# Patient Record
Sex: Male | Born: 1955
Health system: Southern US, Community
[De-identification: ages and names within clinical notes are randomized; demographics above are authoritative.]

## PROBLEM LIST (undated history)

## (undated) DIAGNOSIS — E785 Hyperlipidemia, unspecified: Secondary | ICD-10-CM

## (undated) DIAGNOSIS — I10 Essential (primary) hypertension: Secondary | ICD-10-CM

## (undated) DIAGNOSIS — H8110 Benign paroxysmal vertigo, unspecified ear: Secondary | ICD-10-CM

## (undated) HISTORY — DX: Essential (primary) hypertension: I10

## (undated) HISTORY — PX: CHOLECYSTECTOMY: SHX55

## (undated) HISTORY — DX: Benign paroxysmal vertigo, unspecified ear: H81.10

## (undated) HISTORY — PX: CYSTECTOMY: SUR359

## (undated) HISTORY — DX: Hyperlipidemia, unspecified: E78.5

---

## 2005-09-10 ENCOUNTER — Emergency Department (HOSPITAL_COMMUNITY): Admission: EM | Admit: 2005-09-10 | Discharge: 2005-09-10 | Payer: Self-pay | Admitting: Emergency Medicine

## 2008-06-20 ENCOUNTER — Encounter: Admission: RE | Admit: 2008-06-20 | Discharge: 2008-06-20 | Payer: Self-pay | Admitting: Family Medicine

## 2008-06-24 ENCOUNTER — Encounter: Admission: RE | Admit: 2008-06-24 | Discharge: 2008-06-24 | Payer: Self-pay | Admitting: Family Medicine

## 2009-02-04 ENCOUNTER — Emergency Department (HOSPITAL_COMMUNITY): Admission: EM | Admit: 2009-02-04 | Discharge: 2009-02-04 | Payer: Self-pay | Admitting: Family Medicine

## 2010-12-01 ENCOUNTER — Encounter
Admission: RE | Admit: 2010-12-01 | Discharge: 2010-12-01 | Payer: Self-pay | Source: Home / Self Care | Attending: Family Medicine | Admitting: Family Medicine

## 2010-12-31 ENCOUNTER — Ambulatory Visit (HOSPITAL_COMMUNITY)
Admission: RE | Admit: 2010-12-31 | Discharge: 2010-12-31 | Disposition: A | Discharge status: Home / Self Care | Payer: 59 | Source: Ambulatory Visit | Attending: General Surgery | Admitting: General Surgery

## 2010-12-31 ENCOUNTER — Encounter (HOSPITAL_COMMUNITY)
Admission: RE | Admit: 2010-12-31 | Discharge: 2010-12-31 | Disposition: A | Discharge status: Home / Self Care | Payer: 59 | Source: Ambulatory Visit | Attending: General Surgery | Admitting: General Surgery

## 2010-12-31 ENCOUNTER — Other Ambulatory Visit (HOSPITAL_COMMUNITY): Payer: Self-pay | Admitting: General Surgery

## 2010-12-31 DIAGNOSIS — Z01810 Encounter for preprocedural cardiovascular examination: Secondary | ICD-10-CM | POA: Insufficient documentation

## 2010-12-31 DIAGNOSIS — Z01812 Encounter for preprocedural laboratory examination: Secondary | ICD-10-CM | POA: Insufficient documentation

## 2010-12-31 DIAGNOSIS — K8020 Calculus of gallbladder without cholecystitis without obstruction: Secondary | ICD-10-CM | POA: Insufficient documentation

## 2010-12-31 DIAGNOSIS — Z01818 Encounter for other preprocedural examination: Secondary | ICD-10-CM | POA: Insufficient documentation

## 2010-12-31 DIAGNOSIS — I10 Essential (primary) hypertension: Secondary | ICD-10-CM | POA: Insufficient documentation

## 2010-12-31 LAB — DIFFERENTIAL
Basophils Relative: 1 % (ref 0–1)
Eosinophils Absolute: 0.2 10*3/uL (ref 0.0–0.7)
Eosinophils Relative: 3 % (ref 0–5)
Lymphocytes Relative: 18 % (ref 12–46)
Lymphs Abs: 1.2 10*3/uL (ref 0.7–4.0)
Monocytes Absolute: 0.6 10*3/uL (ref 0.1–1.0)
Monocytes Relative: 10 % (ref 3–12)
Neutrophils Relative %: 70 % (ref 43–77)

## 2010-12-31 LAB — COMPREHENSIVE METABOLIC PANEL
ALT: 29 U/L (ref 0–53)
Albumin: 3.9 g/dL (ref 3.5–5.2)
BUN: 14 mg/dL (ref 6–23)
CO2: 29 mEq/L (ref 19–32)
Calcium: 9.4 mg/dL (ref 8.4–10.5)
Chloride: 105 mEq/L (ref 96–112)
Creatinine, Ser: 0.97 mg/dL (ref 0.4–1.5)
GFR calc Af Amer: >60 mL/min (ref >60)
GFR calc non Af Amer: >60 mL/min (ref >60)
Glucose, Bld: 88 mg/dL (ref 70–99)
Potassium: 4.5 mEq/L (ref 3.5–5.1)
Sodium: 142 mEq/L (ref 135–145)
Total Bilirubin: 1 mg/dL (ref 0.3–1.2)
Total Protein: 6.1 g/dL (ref 6.0–8.3)

## 2010-12-31 LAB — CBC
HCT: 42.8 % (ref 39.0–52.0)
Hemoglobin: 15.4 g/dL (ref 13.0–17.0)
MCH: 32.7 pg (ref 26.0–34.0)
MCV: 90.9 fL (ref 78.0–100.0)
Platelets: 163 10*3/uL (ref 150–400)
RDW: 13.1 % (ref 11.5–15.5)
WBC: 6.5 10*3/uL (ref 4.0–10.5)

## 2010-12-31 LAB — SURGICAL PCR SCREEN: MRSA, PCR: NEGATIVE

## 2011-01-06 ENCOUNTER — Other Ambulatory Visit: Payer: Self-pay | Admitting: General Surgery

## 2011-01-06 ENCOUNTER — Observation Stay (HOSPITAL_COMMUNITY)
Admission: RE | Admit: 2011-01-06 | Discharge: 2011-01-08 | Disposition: A | Discharge status: Home / Self Care | Payer: 59 | Source: Ambulatory Visit | Attending: General Surgery | Admitting: General Surgery

## 2011-01-06 ENCOUNTER — Ambulatory Visit (HOSPITAL_COMMUNITY): Payer: 59

## 2011-01-06 DIAGNOSIS — I10 Essential (primary) hypertension: Secondary | ICD-10-CM | POA: Insufficient documentation

## 2011-01-06 DIAGNOSIS — K8010 Calculus of gallbladder with chronic cholecystitis without obstruction: Principal | ICD-10-CM | POA: Insufficient documentation

## 2011-01-07 ENCOUNTER — Observation Stay (HOSPITAL_COMMUNITY): Payer: 59

## 2011-01-07 LAB — COMPREHENSIVE METABOLIC PANEL
ALT: 269 U/L — ABNORMAL HIGH (ref 0–53)
AST: 119 U/L — ABNORMAL HIGH (ref 0–37)
Albumin: 3.4 g/dL — ABNORMAL LOW (ref 3.5–5.2)
Alkaline Phosphatase: 47 U/L (ref 39–117)
BUN: 8 mg/dL (ref 6–23)
Calcium: 8.9 mg/dL (ref 8.4–10.5)
Chloride: 105 mEq/L (ref 96–112)
Creatinine, Ser: 0.93 mg/dL (ref 0.4–1.5)
GFR calc Af Amer: >60 mL/min (ref >60)
Glucose, Bld: 117 mg/dL — ABNORMAL HIGH (ref 70–99)
Potassium: 4.3 mEq/L (ref 3.5–5.1)
Sodium: 138 mEq/L (ref 135–145)
Total Bilirubin: 0.8 mg/dL (ref 0.3–1.2)

## 2011-01-08 LAB — COMPREHENSIVE METABOLIC PANEL
ALT: 191 U/L — ABNORMAL HIGH (ref 0–53)
AST: 53 U/L — ABNORMAL HIGH (ref 0–37)
Albumin: 3.6 g/dL (ref 3.5–5.2)
Alkaline Phosphatase: 49 U/L (ref 39–117)
BUN: 8 mg/dL (ref 6–23)
CO2: 31 mEq/L (ref 19–32)
Creatinine, Ser: 0.82 mg/dL (ref 0.4–1.5)
GFR calc Af Amer: >60 mL/min (ref >60)
Glucose, Bld: 92 mg/dL (ref 70–99)
Potassium: 3.8 mEq/L (ref 3.5–5.1)
Sodium: 140 mEq/L (ref 135–145)
Total Bilirubin: 1 mg/dL (ref 0.3–1.2)
Total Protein: 6.2 g/dL (ref 6.0–8.3)

## 2011-01-14 NOTE — Consult Note (Signed)
  James Young, [place]ST[place]T NO.:  [id number]  MEDICAL RECORD NO.:  [id number]           PATIENT TYPE:  O  LOCATION:  XRAY                         FACILITY:  [facility]  PHYSICIAN:  Alekai Pocock C. Madilyn Fireman, M.D.    DATE OF BIRTH:  [birth year]  DATE OF CONSULTATION: DATE OF DISCHARGE:  12/31/2010                                CONSULTATION   REASON FOR CONSULTATION:  Suspected common bile duct stone.  HISTORY OF PRESENT ILLNESS:  The patient is a 55 year old white male who underwent elective cholecystectomy for symptomatic gallstones.  He had an intraoperative cholangiogram, which did not show any drainage of distal bile duct.  There was no definite stone seen, but there was no drainage.  The bile duct was 4-5 mm in diameter.  He had preoperative liver function tests, which were normal.  PAST MEDICAL HISTORY: 1. Hypertension. 2. Hyperlipidemia.  MEDICATIONS:  Lovastatin, lisinopril, aspirin.  PAST SURGICAL HISTORY:  Cyst removed from back.  SOCIAL HISTORY:  The patient denies cigarette smoking.  Drinks alcohol occasionally.  He is married.  He has three children.  ALLERGIES:  None known.  PHYSICAL EXAM:  GENERAL:  Well-developed, well-nourished white male, in no acute stress. HEART:  Regular rate and rhythm without murmurs or rubs. ABDOMEN:  Soft, nondistended with normoactive bowel sounds.  No hepatosplenomegaly, mass or guarding.  There is mild tenderness over his laparoscopy sites.  IMPRESSION:  Relatively low suspicion of retained common bile duct stones based solely on cholangiogram.  PLAN:  We will reassess tomorrow.  Recheck liver function tests and decide whether to perform ERCP, MRCP or expectant management.  We will follow with you.         ______________________________ Everardo All. Madilyn Fireman, M.D.    JCH/MEDQ  D:  01/06/2011  T:  01/06/2011  Job:  [id number]  Electronically Signed by Dorena Cookey M.D. on 01/12/2011 07:10:31 PM

## 2011-01-14 NOTE — Op Note (Signed)
  James Young, DAVIDOW                ACCOUNT NO.:  [id number]  MEDICAL RECORD NO.:  [id number]           PATIENT TYPE:  LOCATION:                                 FACILITY:  PHYSICIAN:  Reisa Coppola C. Madilyn Fireman, M.D.    DATE OF BIRTH:  1956-02-26  DATE OF PROCEDURE:  01/07/2011 DATE OF DISCHARGE:                              OPERATIVE REPORT   PROCEDURE:  An endoscopic retrograde cholangiopancreatography with sphincterotomy and stone extraction.  INDICATIONS FOR PROCEDURE:  Suspected common bile duct stone on intraoperative cholangiogram with rise in liver function tests overnight.  PROCEDURE IN DETAIL:  The patient was placed in the prone position and placed on the pulse monitor with continuous low-flow oxygen delivered by nasal cannula.  He was sedated with 120 mcg of IV fentanyl and 12 mg IV Versed.  Olympus video side-viewing endoscope was advanced blindly in the oropharynx, esophagus, stomach.  The pylorus was traversed and papilla of Vater located on the medial duodenal wall.  It had a normal appearance.  Free cannulation was initially difficult.  Initial guide wire and dye opacification filled a normal-appearing pancreatic duct with only one injection done.  Subsequent selective cannulation was achieved with a guidewire and common bile duct and a cholangiogram was obtained, which showed a normal to slightly dilated common bile duct with some slight distal dilatation.  I could not clearly see a stone.  I went ahead and made a sphincterotomy and some sludge was seen to exit from the papilla.  Several balloon sweeps were made and another small black specks of sludge were retrieved, but no definite stone of any greater than 1 mm.  After multiple balloon sweeps, the cholangiograms appeared to be clear and there was good drainage.  The scope was then withdrawn and the patient returned to the recovery room in stable condition.  He tolerated the procedure well.  There were no  immediate complications.  IMPRESSION:  Common bile duct sludge removed after sphincterotomy.  PLAN:  Observe overnight for complications, probable discharge in the morning.          ______________________________ Everardo All. Madilyn Fireman, M.D.     JCH/MEDQ  D:  01/07/2011  T:  01/08/2011  Job:  [id number]  cc:   Ollen Gross. Vernell Morgans, M.D.  Electronically Signed by Dorena Cookey M.D. on 01/12/2011 07:10:44 PM

## 2011-01-18 NOTE — Op Note (Signed)
NAMEGASPER, HOPES NO.:  [id number]  MEDICAL RECORD NO.:  [id number]           PATIENT TYPE:  I  LOCATION:  5152                         FACILITY:  [facility]  PHYSICIAN:  Ollen Gross. Vernell Morgans, M.D. DATE OF BIRTH:  1956/07/30  DATE OF PROCEDURE:  01/06/2011 DATE OF DISCHARGE:                              OPERATIVE REPORT   PREOPERATIVE DIAGNOSIS:  Gallstones.  POSTOPERATIVE DIAGNOSIS:  Gallstones with possible common duct stone.  PROCEDURES:  Laparoscopic cholecystectomy with intraoperative cholangiogram.  SURGEON:  Ollen Gross. Vernell Morgans, MD  ASSISTANT:  Wilmon Arms. Tsuei, MD  ANESTHESIA:  General endotracheal.  DESCRIPTION OF PROCEDURE:  After informed consent was obtained, the patient was brought to the operating room, placed in supine position on the operating table.  After induction of general anesthesia, the patient's abdomen was prepped with ChloraPrep, allowed to dry, and draped in usual sterile fashion.  The area below the umbilicus was infiltrated with 0.25% Marcaine.  A small incision was made with 15 blade knife.  This incision was carried down through the subcutaneous tissue bluntly with hemostat and [employer] retractors until linea alba was identified.  The linea alba was incised with 15 blade knife on each side, was grasped with Kocher clamps and elevated anteriorly, and the preperitoneal space was then probed bluntly with hemostat until the peritoneum was opened.  Access was gained to the abdominal cavity.  A 0 Vicryl pursestring stitch was placed in the fascia around the opening. Hasson cannula was placed through the opening and anchored in place with a previously placed Vicryl pursestring stitch.  The abdomen was then insufflated with carbon dioxide without difficulty.  The patient was placed in reverse Trendelenburg position and rotated with the right side up.  The laparoscope was inserted through Hasson Cannula.  The dome of the gallbladder and  liver readily identified.  The epigastric region was then infiltrated with 0.25% Marcaine.  A small incision was made with a 15 blade knife and a 10-mm port was placed bluntly through this incision into the abdominal cavity under direct vision.  Sites were then chosen laterally on the right side of the abdomen with placement of 5-mm ports. Each of these areas was infiltrated with 0.25% Marcaine.  Small stab incisions were made with 15 blade knife.  A 5-mm port placed bluntly through these incisions into the abdominal cavity under direct vision. Blunt grasper was placed through the lateral-most 5-mm port and used to grasp the dome of gallbladder and elevated anteriorly and superiorly. Another blunt grasper was placed through other 5-mm port and used to retract the body and neck of the gallbladder.  Some omental adhesions to the body of the gallbladder taken down sharply with the electrocautery. The peritoneal reflection at the gallbladder neck was then opened sharply with the electrocautery.  Blunt dissection was carried out in this area until the gallbladder neck and cystic duct junction was readily identified and a good window was created.  Single clip was placed on the gallbladder neck.  A small ductotomy was made just below the clip with the laparoscopic scissors.  A 14-gauge Angiocath was  placed percutaneously through the anterior abdominal wall under direct vision.  A Reddick cholangiogram catheter was placed through the Angiocath and flushed.  The Reddick catheter was then placed in the cystic duct and anchored in place of the clip.  A cholangiogram was obtained that showed an obstruction to the very distal common bile duct near the ampulla with no emptying into the duodenum.  There was good length on the cystic duct, but it was spiralled and we could not see the catheter down to try to open up the common bile duct.  We therefore removed the clips and catheters.  Three clips were  placed proximal on the cystic duct, and duct was divided between 2 sets of clips. Posterior to this, the cystic artery was identified with some branches. Each was clipped with 2 clips proximally and distally, and divided the 2.  Next, a laparoscopic hook cautery device was used to separate the gallbladder from liver bed prior to completely detaching the gallbladder from liver bed.  The liver bed was inspected and several small bleeding points were coagulated with the electrocautery until the area was completely hemostatic.  The gallbladder was then detached and wrested away from liver bed without difficult.  The laparoscopic bag was inserted through the epigastric port.  Gallbladder was placed in the bag.  The bag was sealed.  The laparoscope was then moved to the epigastric port.  A gallbladder grasper was placed through the Hasson cannula and used to grasp the opening of the bag.  The bag with the gallbladder was removed with the [facility] cannula through the infraumbilical port without difficulty.  The fascial defect was closed with previously placed Vicryl pursestring stitch as well as with another figure-of-eight 0 Vicryl stitch.  The rest of ports were removed under direct vision and all found to be hemostatic.  The gas was allowed to escape.  The skin incisions were all closed with interrupted 4-0 Monocryl subcuticular stitches.  Dermabond dressings were then applied. The patient tolerated the procedure well.  At the end of the case, all needle, sponge, and instrument counts were correct.  The patient was then awakened and taken to recovery room in stable condition.     Ollen Gross. Vernell Morgans, M.D.     PST/MEDQ  D:  01/06/2011  T:  01/06/2011  Job:  [id number]  Electronically Signed by Chevis Pretty III M.D. on 01/18/2011 07:02:56 AM

## 2011-02-01 NOTE — Discharge Summary (Signed)
  NAMEJAQUA, CHING NO.:  [id number]  MEDICAL RECORD NO.:  [id number]           PATIENT TYPE:  I  LOCATION:  5152                         FACILITY:  [facility]  PHYSICIAN:  Ollen Gross. Vernell Morgans, M.D. DATE OF BIRTH:  November 22, 1955  DATE OF ADMISSION:  01/06/2011 DATE OF DISCHARGE:  01/08/2011                              DISCHARGE SUMMARY   Mr. Scheffler is a 55 year old gentleman, who had gallstones and normal liver functions.  He was brought to the operating room on February 29 and underwent a laparoscopic cholecystectomy with intraoperative cholangiograms.  His cholangiogram showed a possible distal common bile duct stone with no emptying into the duodenum.  A GI consult was obtained postoperatively.  They proceeded with ERCP on March 1.  He tolerated this well.  On March 2, he was doing better and ready for discharge home.  DIET:  As tolerated.  ACTIVITIES:  No heavy lifting.  MEDICATIONS:  He was to resume his home meds.  He was given a prescription for pain medicine.  FINAL DIAGNOSIS:  Gallstones.  FOLLOWUP:  With Dr. Carolynne Edouard in the next week or two and he is discharged home.     Ollen Gross. Vernell Morgans, M.D.     PST/MEDQ  D:  01/19/2011  T:  01/20/2011  Job:  [id number]  Electronically Signed by Chevis Pretty III M.D. on 02/01/2011 07:30:43 AM

## 2011-02-18 LAB — CULTURE, ROUTINE-ABSCESS

## 2015-04-02 ENCOUNTER — Encounter (HOSPITAL_COMMUNITY): Payer: Self-pay | Admitting: Emergency Medicine

## 2015-04-02 ENCOUNTER — Emergency Department (HOSPITAL_COMMUNITY)
Admission: EM | Admit: 2015-04-02 | Discharge: 2015-04-02 | Disposition: A | Discharge status: Home / Self Care | Payer: BLUE CROSS/BLUE SHIELD | Attending: Emergency Medicine | Admitting: Emergency Medicine

## 2015-04-02 ENCOUNTER — Emergency Department (HOSPITAL_COMMUNITY): Payer: BLUE CROSS/BLUE SHIELD

## 2015-04-02 DIAGNOSIS — I10 Essential (primary) hypertension: Secondary | ICD-10-CM | POA: Diagnosis not present

## 2015-04-02 DIAGNOSIS — Z7982 Long term (current) use of aspirin: Secondary | ICD-10-CM | POA: Diagnosis not present

## 2015-04-02 DIAGNOSIS — Z79899 Other long term (current) drug therapy: Secondary | ICD-10-CM | POA: Insufficient documentation

## 2015-04-02 DIAGNOSIS — R091 Pleurisy: Secondary | ICD-10-CM | POA: Insufficient documentation

## 2015-04-02 DIAGNOSIS — R079 Chest pain, unspecified: Secondary | ICD-10-CM | POA: Diagnosis not present

## 2015-04-02 DIAGNOSIS — R05 Cough: Secondary | ICD-10-CM | POA: Diagnosis not present

## 2015-04-02 DIAGNOSIS — I2510 Atherosclerotic heart disease of native coronary artery without angina pectoris: Secondary | ICD-10-CM | POA: Diagnosis not present

## 2015-04-02 LAB — BASIC METABOLIC PANEL
Anion gap: 9 (ref 5–15)
BUN: 20 mg/dL (ref 6–20)
CALCIUM: 9.1 mg/dL (ref 8.9–10.3)
CHLORIDE: 101 mmol/L (ref 101–111)
CO2: 28 mmol/L (ref 22–32)
CREATININE: 0.94 mg/dL (ref 0.61–1.24)
GFR calc Af Amer: >60 mL/min (ref >60)
GFR calc non Af Amer: >60 mL/min (ref >60)
Glucose, Bld: 118 mg/dL — ABNORMAL HIGH (ref 65–99)
Potassium: 4.3 mmol/L (ref 3.5–5.1)
SODIUM: 138 mmol/L (ref 135–145)

## 2015-04-02 LAB — TROPONIN I

## 2015-04-02 LAB — CBC
HEMATOCRIT: 42.1 % (ref 39.0–52.0)
Hemoglobin: 14.4 g/dL (ref 13.0–17.0)
MCH: 31.4 pg (ref 26.0–34.0)
MCHC: 34.2 g/dL (ref 30.0–36.0)
MCV: 91.9 fL (ref 78.0–100.0)
PLATELETS: 221 10*3/uL (ref 150–400)
RBC: 4.58 MIL/uL (ref 4.22–5.81)
RDW: 13.5 % (ref 11.5–15.5)
WBC: 10.1 10*3/uL (ref 4.0–10.5)

## 2015-04-02 LAB — D-DIMER, QUANTITATIVE: D-Dimer, Quant: 1 ug/mL-FEU — ABNORMAL HIGH (ref 0.00–0.48)

## 2015-04-02 LAB — BRAIN NATRIURETIC PEPTIDE: B Natriuretic Peptide: 28.9 pg/mL (ref 0.0–100.0)

## 2015-04-02 LAB — I-STAT TROPONIN, ED: TROPONIN I, POC: 0 ng/mL (ref 0.00–0.08)

## 2015-04-02 MED ORDER — GI COCKTAIL ~~LOC~~
30.0000 mL | Freq: Once | ORAL | Status: AC
  Administered 2015-04-02: 30 mL via ORAL
  Filled 2015-04-02: qty 30

## 2015-04-02 MED ORDER — IOHEXOL 350 MG/ML SOLN
100.0000 mL | Freq: Once | INTRAVENOUS | Status: AC | PRN
  Administered 2015-04-02: 80 mL via INTRAVENOUS

## 2015-04-02 NOTE — Discharge Instructions (Signed)
You were seen today for chest pain. Your pain was atypical for heart attack; however, on her CT scan you were noted to have coronary artery disease. He has no evidence of blood clot. The description of your pain and recent cough with upper respiratory symptoms, makes it likely the you are experiencing lung irritation. Regardless he need to follow-up with cardiology for definitive cardiac testing. You also need to follow-up with your primary physician. You were noted to have a nodule on her CT scan as well. This may need follow-up.  Chest Pain (Nonspecific) It is often hard to give a specific diagnosis for the cause of chest pain. There is always a chance that your pain could be related to something serious, such as a heart attack or a blood clot in the lungs. You need to follow up with your health care provider for further evaluation. CAUSES   Heartburn.  Pneumonia or bronchitis.  Anxiety or stress.  Inflammation around your heart (pericarditis) or lung (pleuritis or pleurisy).  A blood clot in the lung.  A collapsed lung (pneumothorax). It can develop suddenly on its own (spontaneous pneumothorax) or from trauma to the chest.  Shingles infection (herpes zoster virus). The chest wall is composed of bones, muscles, and cartilage. Any of these can be the source of the pain.  The bones can be bruised by injury.  The muscles or cartilage can be strained by coughing or overwork.  The cartilage can be affected by inflammation and become sore (costochondritis). DIAGNOSIS  Lab tests or other studies may be needed to find the cause of your pain. Your health care provider may have you take a test called an ambulatory electrocardiogram (ECG). An ECG records your heartbeat patterns over a 24-hour period. You may also have other tests, such as:  Transthoracic echocardiogram (TTE). During echocardiography, sound waves are used to evaluate how blood flows through your heart.  Transesophageal  echocardiogram (TEE).  Cardiac monitoring. This allows your health care provider to monitor your heart rate and rhythm in real time.  Holter monitor. This is a portable device that records your heartbeat and can help diagnose heart arrhythmias. It allows your health care provider to track your heart activity for several days, if needed.  Stress tests by exercise or by giving medicine that makes the heart beat faster. TREATMENT   Treatment depends on what may be causing your chest pain. Treatment may include:  Acid blockers for heartburn.  Anti-inflammatory medicine.  Pain medicine for inflammatory conditions.  Antibiotics if an infection is present.  You may be advised to change lifestyle habits. This includes stopping smoking and avoiding alcohol, caffeine, and chocolate.  You may be advised to keep your head raised (elevated) when sleeping. This reduces the chance of acid going backward from your stomach into your esophagus. Most of the time, nonspecific chest pain will improve within 2-3 days with rest and mild pain medicine.  HOME CARE INSTRUCTIONS   If antibiotics were prescribed, take them as directed. Finish them even if you start to feel better.  For the next few days, avoid physical activities that bring on chest pain. Continue physical activities as directed.  Do not use any tobacco products, including cigarettes, chewing tobacco, or electronic cigarettes.  Avoid drinking alcohol.  Only take medicine as directed by your health care provider.  Follow your health care provider's suggestions for further testing if your chest pain does not go away.  Keep any follow-up appointments you made. If you do not  go to an appointment, you could develop lasting (chronic) problems with pain. If there is any problem keeping an appointment, call to reschedule. SEEK MEDICAL CARE IF:   Your chest pain does not go away, even after treatment.  You have a rash with blisters on your  chest.  You have a fever. SEEK IMMEDIATE MEDICAL CARE IF:   You have increased chest pain or pain that spreads to your arm, neck, jaw, back, or abdomen.  You have shortness of breath.  You have an increasing cough, or you cough up blood.  You have severe back or abdominal pain.  You feel nauseous or vomit.  You have severe weakness.  You faint.  You have chills. This is an emergency. Do not wait to see if the pain will go away. Get medical help at once. Call your local emergency services (911 in [place] Do not drive yourself to the hospital. MAKE SURE YOU:   Understand these instructions.  Will watch your condition.  Will get help right away if you are not doing well or get worse. Document Released: 08/04/2005 Document Revised: 10/30/2013 Document Reviewed: 05/30/2008 [facility] Patient Information 2015 [place], [place]. This information is not intended to replace advice given to you by your health care provider. Make sure you discuss any questions you have with your health care provider.

## 2015-04-02 NOTE — ED Notes (Addendum)
Pt states that he was lying down to go to bed and started having mid-left chest pain that radiated to his left jaw, shoulder, and ear. Pt reports pain when he tries to take a deep breath, states that he recently got over a cold and was coughing a lot then, denies having cough today. Pt states he took 3 baby asa and pain has now decreased to 2/10 down from 5/10. Pt alert, oriented, nad,

## 2015-04-02 NOTE — ED Provider Notes (Signed)
CSN: [id number]     Arrival date & time 04/02/15  0128 History  This chart was scribed for Shon Baton, MD by Doreatha Martin, ED Scribe. This patient was seen in room B14C/B14C and the patient's care was started at 2:05 AM.     Chief Complaint  Patient presents with  . Chest Pain   The history is provided by the patient. No language interpreter was used.   HPI Comments: James Young is a 59 y.o. male with Hx of HTN who presents to the Emergency Department complaining of moderate, constant, burning, stabbing 1/10 left-sided CP radiating to the left shoulder, left jaw and left ear onset earlier this evening. Pt states he was asleep prior to onset. He also reports stabbing pain exacerbated with inhalation. He states that he has not had pain like this before. Pt reports that his mild HTN is well controlled by Lisinopril. Pt states he took 3 81 mg Aspirin PTA with moderate relief. He reports that he recently recovered from a cold with which he had associated cough. Pt denies recent long travels, Hx of HLD, DM, and FHx of CAD<55 y.o. He also denies edema in the legs, SOB or cough.   History reviewed. No pertinent past medical history. History reviewed. No pertinent past surgical history. No family history on file. History  Substance Use Topics  . Smoking status: Never Smoker   . Smokeless tobacco: Not on file  . Alcohol Use: Yes    Review of Systems  Constitutional: Negative.  Negative for fever.  Respiratory: Positive for cough. Negative for chest tightness and shortness of breath.   Cardiovascular: Positive for chest pain.  Gastrointestinal: Negative.  Negative for nausea, vomiting and abdominal pain.  Genitourinary: Negative.  Negative for dysuria.  Musculoskeletal: Negative for back pain.  Neurological: Negative for headaches.  All other systems reviewed and are negative.     Allergies  Review of patient's allergies indicates no known allergies.  Home Medications   Prior to  Admission medications   Medication Sig Start Date End Date Taking? Authorizing Provider  aspirin 81 MG chewable tablet Chew 81 mg by mouth daily.   Yes Historical Provider, MD  lisinopril (PRINIVIL,ZESTRIL) 10 MG tablet Take 10 mg by mouth daily.  03/03/15  Yes Historical Provider, MD  lovastatin (MEVACOR) 10 MG tablet Take 10 mg by mouth at bedtime.  03/03/15  Yes Historical Provider, MD  Omega-3 Fatty Acids (FISH OIL PO) Take 1 tablet by mouth daily.   Yes Historical Provider, MD   BP 111/76 mmHg  Pulse 76  Temp(Src) 98.6 F (37 C) (Oral)  Resp 16  SpO2 95% Physical Exam  Constitutional: He is oriented to person, place, and time. He appears well-developed and well-nourished. No distress.  HENT:  Head: Normocephalic and atraumatic.  Eyes: Pupils are equal, round, and reactive to light.  Neck: Neck supple.  Cardiovascular: Normal rate, regular rhythm and normal heart sounds.   No murmur heard. Pulmonary/Chest: Effort normal and breath sounds normal. No respiratory distress. He has no wheezes.  Abdominal: Soft. Bowel sounds are normal. There is no tenderness. There is no rebound.  Musculoskeletal: He exhibits no edema.  Lymphadenopathy:    He has no cervical adenopathy.  Neurological: He is alert and oriented to person, place, and time.  Skin: Skin is warm and dry.  Psychiatric: He has a normal mood and affect.  Nursing note and vitals reviewed.   ED Course  Procedures (including critical care time) DIAGNOSTIC STUDIES:  Oxygen Saturation is 94% on RA, adequate by my interpretation.    COORDINATION OF CARE: 2:09 AM Discussed treatment plan with pt at bedside and pt agreed to plan.   Labs Review Labs Reviewed  BASIC METABOLIC PANEL - Abnormal; Notable for the following:    Glucose, Bld 118 (*)    All other components within normal limits  D-DIMER, QUANTITATIVE - Abnormal; Notable for the following:    D-Dimer, Quant 1.00 (*)    All other components within normal limits  CBC   BRAIN NATRIURETIC PEPTIDE  TROPONIN I  I-STAT TROPOININ, ED    Imaging Review Dg Chest 2 View  04/02/2015   CLINICAL DATA:  Chest pain starting 45 minutes ago  EXAM: CHEST  2 VIEW  COMPARISON:  12/31/2010  FINDINGS: Normal heart size and mediastinal contours. No acute infiltrate or edema. No effusion or pneumothorax.  Probable calcified pulmonary nodules on the right.  Remote appearing lateral left tenth and eleventh rib fractures.  IMPRESSION: No active cardiopulmonary disease.   Electronically Signed   By: Marnee Spring M.D.   On: 04/02/2015 02:01   Ct Angio Chest Pe W/cm &/or Wo Cm  04/02/2015   CLINICAL DATA:  Chest pain starting at 1 a.m. with worsening with deep breathing. Concern for pulmonary embolism.  EXAM: CT ANGIOGRAPHY CHEST WITH CONTRAST  TECHNIQUE: Multidetector CT imaging of the chest was performed using the standard protocol during bolus administration of intravenous contrast. Multiplanar CT image reconstructions and MIPs were obtained to evaluate the vascular anatomy.  CONTRAST:  80mL OMNIPAQUE IOHEXOL 350 MG/ML SOLN  COMPARISON:  None.  FINDINGS: THORACIC INLET/BODY WALL:  There is a solitary nodule in the right thyroid gland which measures 33 mm craniocaudal and 16 mm AP. No evidence of neck adenopathy.  Probable collapsed dermal inclusion cyst in the right lower back.  MEDIASTINUM:  Normal heart size. No pericardial effusion. Coronary atherosclerosis, best seen along the proximal LAD. No acute vascular abnormality, including pulmonary embolism or aortic dissection. No adenopathy.  LUNG WINDOWS:  No consolidation. No effusion. Bilateral calcified pulmonary nodules consistent with remote granulomatous disease. There are 2 noncalcified pulmonary nodules in the left upper lobe on image 50, measuring up to 4 mm.  UPPER ABDOMEN:  No acute findings.  OSSEOUS:  No acute fracture.  No suspicious lytic or blastic lesions.  Review of the MIP images confirms the above findings.  IMPRESSION: 1.  No evidence of pulmonary embolism or other acute disease. 2. Coronary atherosclerosis. 3. Solitary right thyroid nodule 33 mm in maximal diameter. Recommend outpatient thyroid ultrasound. 4. 4 mm left upper lobe pulmonary nodule. See recommendations below.  RECOMMENDATIONS: If the patient is at high risk for bronchogenic carcinoma, follow-up chest CT at 1 year is recommended. If the patient is at low risk, no follow-up is needed. This recommendation follows the consensus statement: Guidelines for Management of Small Pulmonary Nodules Detected on CT Scans: A Statement from the Fleischner Society as published in Radiology 2005; 237:395-400.   Electronically Signed   By: Marnee Spring M.D.   On: 04/02/2015 05:03     EKG Interpretation   Date/Time:  Wednesday Apr 02 2015 01:33:47 EDT Ventricular Rate:  86 PR Interval:  158 QRS Duration: 86 QT Interval:  354 QTC Calculation: 423 R Axis:   25 Text Interpretation:  Normal sinus rhythm Normal ECG Similar to prior  Confirmed by HORTON  MD, COURTNEY ([id number]) on 04/02/2015 1:39:02 AM      MDM   Final diagnoses:  Chest pain, unspecified chest pain type  Pleurisy  Coronary artery disease involving native coronary artery of native heart without angina pectoris    Patient presents with chest pain. Nontoxic. Vital signs reassuring. Chest pain is somewhat atypical for ACS but patient has a risk factor of age and hypertension. EKG is normal sinus rhythm. Chest pain is much more pleuritic in nature. Recent URI. However, given patient's age, we'll send screening d-dimer. D-dimer is 1. CTA of the chest obtained and is negative for pulmonary embolism but does show coronary artery disease as well as a pulmonary nodule. Discussed this with the patient. Given the coronary artery disease seen on CTA, repeat troponin obtained. This is negative. Have low suspicion at this time for acute angina; however, would have patient follow up closely with cardiology for  functional testing. Patient continues to endorse pleuritic pain. Suspect pain is secondary to recent URI and coughing. Patient follow-up with his primary physician and cardiology. Patient was advised of incidental pulmonary nodule. He is a remote smoker for 2 years during his teenage years.   After history, exam, and medical workup I feel the patient has been appropriately medically screened and is safe for discharge home. Pertinent diagnoses were discussed with the patient. Patient was given return precautions.  I personally performed the services described in this documentation, which was scribed in my presence. The recorded information has been reviewed and is accurate.   Shon Batonourtney F Horton, MD 04/02/15 [phone]

## 2015-04-02 NOTE — ED Notes (Signed)
Patient transported to X-ray 

## 2015-04-02 NOTE — ED Notes (Signed)
Pt. reports central chest pain with mild SOB onset this evening , pain radiates to left jaw and left ear , denies nausea or diaphoresis . Pt. took 3 baby ASA prior to arrival .

## 2015-04-02 NOTE — ED Notes (Signed)
PT ambulated with baseline gait; VSS; A&Ox3; no signs of distress; respirations even and unlabored; skin warm and dry; no questions upon discharge.  

## 2015-04-23 ENCOUNTER — Encounter: Payer: Self-pay | Admitting: *Deleted

## 2015-04-23 ENCOUNTER — Other Ambulatory Visit: Payer: Self-pay | Admitting: Family Medicine

## 2015-04-23 DIAGNOSIS — E041 Nontoxic single thyroid nodule: Secondary | ICD-10-CM

## 2015-04-24 ENCOUNTER — Ambulatory Visit (INDEPENDENT_AMBULATORY_CARE_PROVIDER_SITE_OTHER): Payer: BLUE CROSS/BLUE SHIELD | Admitting: Cardiology

## 2015-04-24 ENCOUNTER — Encounter: Payer: Self-pay | Admitting: Cardiology

## 2015-04-24 VITALS — BP 134/90 | HR 78 | Ht 72.0 in | Wt 217.0 lb

## 2015-04-24 DIAGNOSIS — I2510 Atherosclerotic heart disease of native coronary artery without angina pectoris: Secondary | ICD-10-CM | POA: Diagnosis not present

## 2015-04-24 DIAGNOSIS — R931 Abnormal findings on diagnostic imaging of heart and coronary circulation: Secondary | ICD-10-CM

## 2015-04-24 NOTE — Patient Instructions (Signed)
Your physician recommends that you schedule a follow-up appointment As Needed  Your physician has requested that you have an exercise tolerance test. For further information please visit [email or web address]. Please also follow instruction sheet, as given.    

## 2015-04-24 NOTE — Progress Notes (Signed)
Cardiology Office Note   Date:  04/24/2015   ID:  JULIANNA CUARTAS, DOB 1956/07/24, MRN [id number]  PCP:  Sissy Hoff, MD  Cardiologist:   Rollene Rotunda, MD   No chief complaint on file.     History of Present Illness: James Young is a 59 y.o. male who presents for evaluation of coronary calcium. He has no past cardiac history. He did have an episode of chest discomfort on May 5. This was 1 night. It radiated to his neck into his arms. With sharp and severe. He presented to the emergency room and I reviewed these records. He did have a mildly elevated d-dimer. However, CT demonstrated no pulmonary embolism. It did demonstrate pulmonary nodule, thyroid nodules and some coronary calcium. I reviewed the CT images personally and he had some LAD calcification. He's not had any further symptoms or symptoms prior to this. 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 With this he denies any cardiovascular symptoms. The patient denies any new symptoms such as chest discomfort, neck or arm discomfort. There has been no new shortness of breath, PND or orthopnea. There have been no reported palpitations, presyncope or syncope.   Past Medical History  Diagnosis Date  . Hyperlipidemia   . Hypertension     Past Surgical History  Procedure Laterality Date  . Cholecystectomy    . Cystectomy      back     Current Outpatient Prescriptions  Medication Sig Dispense Refill  . aspirin 81 MG chewable tablet Chew 81 mg by mouth daily.    Marland Kitchen lisinopril (PRINIVIL,ZESTRIL) 10 MG tablet Take 10 mg by mouth daily.   0  . lovastatin (MEVACOR) 20 MG tablet Take 20 mg by mouth daily.  0  . Omega-3 Fatty Acids (FISH OIL PO) Take 1 tablet by mouth daily.     No current facility-administered medications for this visit.    Allergies:   Review of patient's allergies indicates no known allergies.    Social History:  The patient  reports that he has never smoked. He does not have any smokeless tobacco history on  file. He reports that he drinks alcohol. He reports that he does not use illicit drugs.   Family History:  The patient's family history includes Bipolar disorder in his mother; Bladder Cancer in his brother and father; Heart attack (age of onset: 59) in his maternal grandfather.    ROS:  Please see the history of present illness.   Otherwise, review of systems are positive for none.   All other systems are reviewed and negative.    PHYSICAL EXAM: VS:  BP 134/90 mmHg  Pulse 78  Ht 6' (1.829 m)  Wt 217 lb (98.431 kg)  BMI 29.42 kg/m2 , BMI Body mass index is 29.42 kg/(m^2). GENERAL:  Well appearing HEENT:  Pupils equal round and reactive, fundi not visualized, oral mucosa unremarkable NECK:  No jugular venous distention, waveform within normal limits, carotid upstroke brisk and symmetric, no bruits, no thyromegaly LYMPHATICS:  No cervical, inguinal adenopathy LUNGS:  Clear to auscultation bilaterally BACK:  No CVA tenderness CHEST:  Unremarkable HEART:  PMI not displaced or sustained,S1 and S2 within normal limits, no S3, no S4, no clicks, no rubs, no murmurs ABD:  Flat, positive bowel sounds normal in frequency in pitch, no bruits, no rebound, no guarding, no midline pulsatile mass, no hepatomegaly, no splenomegaly EXT:  2 plus pulses throughout, no edema, no cyanosis no clubbing SKIN:  No rashes  no nodules NEURO:  Cranial nerves II through XII grossly intact, motor grossly intact throughout PSYCH:  Cognitively intact, oriented to person place and time    EKG:  EKG is not ordered today. The ekg ordered 5/25/16demonstrates sinus rhythm rate 86, poor anterior R wave progression, no acute ST-T wave changes.    Recent Labs: 04/02/2015: B Natriuretic Peptide 28.9; BUN 20; Creatinine, Ser 0.94; Hemoglobin 14.4; Platelets 221; Potassium 4.3; Sodium 138    Lipid Panel No results found for: CHOL, TRIG, HDL, CHOLHDL, VLDL, LDLCALC, LDLDIRECT    Wt Readings from Last 3 Encounters:    04/24/15 217 lb (98.431 kg)      Other studies Reviewed: Additional studies/ records that were reviewed today include: CT. Review of the above records demonstrates:  Please see elsewhere in the note.     ASSESSMENT AND PLAN:  CAD:  The patient has coronary calcification. He has no symptoms. We discussed at great length primary risk reduction.  I will bring the patient back for a POET (Plain Old Exercise Test). This will allow me to screen for obstructive coronary disease, risk stratify and very importantly provide a prescription for exercise.  DYSLIPIDEMIA:  This is followed by Sissy Hoff, MD .  I will suggest an LDL goal of less than 100 and ideally 70.  HTN:  The blood pressure is at target. No change in medications is indicated. We will continue with therapeutic lifestyle changes (TLC).    Current medicines are reviewed at length with the patient today.  The patient does not have concerns regarding medicines.  The following changes have been made:  no change  Labs/ tests ordered today include:   Orders Placed This Encounter  Procedures  . Exercise Tolerance Test     Disposition:   FU with me in 2 - 3 years if stress test negative.     Signed, Rollene Rotunda, MD  04/24/2015 10:51 AM    [facility]

## 2015-04-25 ENCOUNTER — Ambulatory Visit
Admission: RE | Admit: 2015-04-25 | Discharge: 2015-04-25 | Disposition: A | Discharge status: Home / Self Care | Payer: BLUE CROSS/BLUE SHIELD | Source: Ambulatory Visit | Attending: Family Medicine | Admitting: Family Medicine

## 2015-04-25 DIAGNOSIS — E041 Nontoxic single thyroid nodule: Secondary | ICD-10-CM

## 2015-05-06 ENCOUNTER — Other Ambulatory Visit: Payer: Self-pay | Admitting: Family Medicine

## 2015-05-06 DIAGNOSIS — E041 Nontoxic single thyroid nodule: Secondary | ICD-10-CM

## 2015-05-07 ENCOUNTER — Other Ambulatory Visit (HOSPITAL_COMMUNITY)
Admission: RE | Admit: 2015-05-07 | Discharge: 2015-05-07 | Disposition: A | Discharge status: Home / Self Care | Payer: BLUE CROSS/BLUE SHIELD | Source: Ambulatory Visit | Attending: Interventional Radiology | Admitting: Interventional Radiology

## 2015-05-07 ENCOUNTER — Ambulatory Visit
Admission: RE | Admit: 2015-05-07 | Discharge: 2015-05-07 | Disposition: A | Discharge status: Home / Self Care | Payer: BLUE CROSS/BLUE SHIELD | Source: Ambulatory Visit | Attending: Family Medicine | Admitting: Family Medicine

## 2015-05-07 DIAGNOSIS — E041 Nontoxic single thyroid nodule: Secondary | ICD-10-CM | POA: Insufficient documentation

## 2015-05-16 ENCOUNTER — Telehealth (HOSPITAL_COMMUNITY): Payer: Self-pay

## 2015-05-21 ENCOUNTER — Ambulatory Visit (HOSPITAL_COMMUNITY)
Admission: RE | Admit: 2015-05-21 | Discharge: 2015-05-21 | Disposition: A | Discharge status: Home / Self Care | Payer: BLUE CROSS/BLUE SHIELD | Source: Ambulatory Visit | Attending: Cardiology | Admitting: Cardiology

## 2015-05-21 DIAGNOSIS — I2510 Atherosclerotic heart disease of native coronary artery without angina pectoris: Secondary | ICD-10-CM | POA: Insufficient documentation

## 2015-05-21 LAB — EXERCISE TOLERANCE TEST
CHL CUP MPHR: 160 {beats}/min
CHL CUP RESTING HR STRESS: 72 {beats}/min
CHL RATE OF PERCEIVED EXERTION: 15
Estimated workload: 13.4 METS
Exercise duration (min): 11 min
Peak HR: 169 {beats}/min
Percent HR: 105 %

## 2015-05-21 NOTE — Telephone Encounter (Signed)
Encounter complete. 

## 2016-03-16 DIAGNOSIS — E782 Mixed hyperlipidemia: Secondary | ICD-10-CM | POA: Diagnosis not present

## 2016-03-16 DIAGNOSIS — J309 Allergic rhinitis, unspecified: Secondary | ICD-10-CM | POA: Diagnosis not present

## 2016-03-16 DIAGNOSIS — I10 Essential (primary) hypertension: Secondary | ICD-10-CM | POA: Diagnosis not present

## 2016-04-21 ENCOUNTER — Other Ambulatory Visit: Payer: Self-pay | Admitting: Family Medicine

## 2016-04-21 DIAGNOSIS — R911 Solitary pulmonary nodule: Secondary | ICD-10-CM

## 2016-04-29 DIAGNOSIS — Z85820 Personal history of malignant melanoma of skin: Secondary | ICD-10-CM | POA: Diagnosis not present

## 2016-04-29 DIAGNOSIS — Z1283 Encounter for screening for malignant neoplasm of skin: Secondary | ICD-10-CM | POA: Diagnosis not present

## 2016-04-29 DIAGNOSIS — Z08 Encounter for follow-up examination after completed treatment for malignant neoplasm: Secondary | ICD-10-CM | POA: Diagnosis not present

## 2016-04-29 DIAGNOSIS — D225 Melanocytic nevi of trunk: Secondary | ICD-10-CM | POA: Diagnosis not present

## 2016-04-30 ENCOUNTER — Ambulatory Visit
Admission: RE | Admit: 2016-04-30 | Discharge: 2016-04-30 | Disposition: A | Discharge status: Home / Self Care | Payer: BLUE CROSS/BLUE SHIELD | Source: Ambulatory Visit | Attending: Family Medicine | Admitting: Family Medicine

## 2016-04-30 DIAGNOSIS — R918 Other nonspecific abnormal finding of lung field: Secondary | ICD-10-CM | POA: Diagnosis not present

## 2016-04-30 DIAGNOSIS — R911 Solitary pulmonary nodule: Secondary | ICD-10-CM

## 2016-08-26 DIAGNOSIS — B078 Other viral warts: Secondary | ICD-10-CM | POA: Diagnosis not present

## 2016-08-26 DIAGNOSIS — Z1283 Encounter for screening for malignant neoplasm of skin: Secondary | ICD-10-CM | POA: Diagnosis not present

## 2016-08-26 DIAGNOSIS — X32XXXA Exposure to sunlight, initial encounter: Secondary | ICD-10-CM | POA: Diagnosis not present

## 2016-08-26 DIAGNOSIS — L570 Actinic keratosis: Secondary | ICD-10-CM | POA: Diagnosis not present

## 2016-08-26 DIAGNOSIS — Z08 Encounter for follow-up examination after completed treatment for malignant neoplasm: Secondary | ICD-10-CM | POA: Diagnosis not present

## 2016-08-26 DIAGNOSIS — Z85820 Personal history of malignant melanoma of skin: Secondary | ICD-10-CM | POA: Diagnosis not present

## 2016-09-16 DIAGNOSIS — I10 Essential (primary) hypertension: Secondary | ICD-10-CM | POA: Diagnosis not present

## 2016-09-16 DIAGNOSIS — E782 Mixed hyperlipidemia: Secondary | ICD-10-CM | POA: Diagnosis not present

## 2016-09-16 DIAGNOSIS — Z23 Encounter for immunization: Secondary | ICD-10-CM | POA: Diagnosis not present

## 2016-09-16 DIAGNOSIS — Z0000 Encounter for general adult medical examination without abnormal findings: Secondary | ICD-10-CM | POA: Diagnosis not present

## 2016-09-16 DIAGNOSIS — Z125 Encounter for screening for malignant neoplasm of prostate: Secondary | ICD-10-CM | POA: Diagnosis not present

## 2016-09-16 DIAGNOSIS — J309 Allergic rhinitis, unspecified: Secondary | ICD-10-CM | POA: Diagnosis not present

## 2016-09-16 DIAGNOSIS — N400 Enlarged prostate without lower urinary tract symptoms: Secondary | ICD-10-CM | POA: Diagnosis not present

## 2016-12-02 DIAGNOSIS — X32XXXD Exposure to sunlight, subsequent encounter: Secondary | ICD-10-CM | POA: Diagnosis not present

## 2016-12-02 DIAGNOSIS — Z08 Encounter for follow-up examination after completed treatment for malignant neoplasm: Secondary | ICD-10-CM | POA: Diagnosis not present

## 2016-12-02 DIAGNOSIS — L570 Actinic keratosis: Secondary | ICD-10-CM | POA: Diagnosis not present

## 2016-12-02 DIAGNOSIS — B078 Other viral warts: Secondary | ICD-10-CM | POA: Diagnosis not present

## 2016-12-02 DIAGNOSIS — Z85828 Personal history of other malignant neoplasm of skin: Secondary | ICD-10-CM | POA: Diagnosis not present

## 2016-12-02 DIAGNOSIS — Z1283 Encounter for screening for malignant neoplasm of skin: Secondary | ICD-10-CM | POA: Diagnosis not present

## 2017-03-10 DIAGNOSIS — X32XXXD Exposure to sunlight, subsequent encounter: Secondary | ICD-10-CM | POA: Diagnosis not present

## 2017-03-10 DIAGNOSIS — Z1283 Encounter for screening for malignant neoplasm of skin: Secondary | ICD-10-CM | POA: Diagnosis not present

## 2017-03-10 DIAGNOSIS — Z85820 Personal history of malignant melanoma of skin: Secondary | ICD-10-CM | POA: Diagnosis not present

## 2017-03-10 DIAGNOSIS — Z08 Encounter for follow-up examination after completed treatment for malignant neoplasm: Secondary | ICD-10-CM | POA: Diagnosis not present

## 2017-03-10 DIAGNOSIS — L570 Actinic keratosis: Secondary | ICD-10-CM | POA: Diagnosis not present

## 2017-06-30 DIAGNOSIS — Z1283 Encounter for screening for malignant neoplasm of skin: Secondary | ICD-10-CM | POA: Diagnosis not present

## 2017-06-30 DIAGNOSIS — Z85820 Personal history of malignant melanoma of skin: Secondary | ICD-10-CM | POA: Diagnosis not present

## 2017-06-30 DIAGNOSIS — B078 Other viral warts: Secondary | ICD-10-CM | POA: Diagnosis not present

## 2017-06-30 DIAGNOSIS — Z08 Encounter for follow-up examination after completed treatment for malignant neoplasm: Secondary | ICD-10-CM | POA: Diagnosis not present

## 2017-09-19 DIAGNOSIS — Z23 Encounter for immunization: Secondary | ICD-10-CM | POA: Diagnosis not present

## 2017-09-19 DIAGNOSIS — N400 Enlarged prostate without lower urinary tract symptoms: Secondary | ICD-10-CM | POA: Diagnosis not present

## 2017-09-19 DIAGNOSIS — Z125 Encounter for screening for malignant neoplasm of prostate: Secondary | ICD-10-CM | POA: Diagnosis not present

## 2017-09-19 DIAGNOSIS — I10 Essential (primary) hypertension: Secondary | ICD-10-CM | POA: Diagnosis not present

## 2017-09-19 DIAGNOSIS — J309 Allergic rhinitis, unspecified: Secondary | ICD-10-CM | POA: Diagnosis not present

## 2017-09-19 DIAGNOSIS — E782 Mixed hyperlipidemia: Secondary | ICD-10-CM | POA: Diagnosis not present

## 2017-09-19 DIAGNOSIS — Z0000 Encounter for general adult medical examination without abnormal findings: Secondary | ICD-10-CM | POA: Diagnosis not present

## 2017-10-04 DIAGNOSIS — Z111 Encounter for screening for respiratory tuberculosis: Secondary | ICD-10-CM | POA: Diagnosis not present

## 2017-10-13 DIAGNOSIS — Z85820 Personal history of malignant melanoma of skin: Secondary | ICD-10-CM | POA: Diagnosis not present

## 2017-10-13 DIAGNOSIS — B078 Other viral warts: Secondary | ICD-10-CM | POA: Diagnosis not present

## 2017-10-13 DIAGNOSIS — L818 Other specified disorders of pigmentation: Secondary | ICD-10-CM | POA: Diagnosis not present

## 2017-10-13 DIAGNOSIS — L814 Other melanin hyperpigmentation: Secondary | ICD-10-CM | POA: Diagnosis not present

## 2017-10-13 DIAGNOSIS — Z08 Encounter for follow-up examination after completed treatment for malignant neoplasm: Secondary | ICD-10-CM | POA: Diagnosis not present

## 2017-10-13 DIAGNOSIS — D225 Melanocytic nevi of trunk: Secondary | ICD-10-CM | POA: Diagnosis not present

## 2017-10-13 DIAGNOSIS — Z1283 Encounter for screening for malignant neoplasm of skin: Secondary | ICD-10-CM | POA: Diagnosis not present

## 2017-11-14 DIAGNOSIS — Z23 Encounter for immunization: Secondary | ICD-10-CM | POA: Diagnosis not present

## 2018-01-19 DIAGNOSIS — Z85820 Personal history of malignant melanoma of skin: Secondary | ICD-10-CM | POA: Diagnosis not present

## 2018-01-19 DIAGNOSIS — Z08 Encounter for follow-up examination after completed treatment for malignant neoplasm: Secondary | ICD-10-CM | POA: Diagnosis not present

## 2018-01-19 DIAGNOSIS — Z1283 Encounter for screening for malignant neoplasm of skin: Secondary | ICD-10-CM | POA: Diagnosis not present

## 2018-03-23 DIAGNOSIS — Z23 Encounter for immunization: Secondary | ICD-10-CM | POA: Diagnosis not present

## 2018-08-14 DIAGNOSIS — Z85820 Personal history of malignant melanoma of skin: Secondary | ICD-10-CM | POA: Diagnosis not present

## 2018-08-14 DIAGNOSIS — Z1283 Encounter for screening for malignant neoplasm of skin: Secondary | ICD-10-CM | POA: Diagnosis not present

## 2018-08-14 DIAGNOSIS — Z08 Encounter for follow-up examination after completed treatment for malignant neoplasm: Secondary | ICD-10-CM | POA: Diagnosis not present

## 2018-08-14 DIAGNOSIS — L570 Actinic keratosis: Secondary | ICD-10-CM | POA: Diagnosis not present

## 2018-08-14 DIAGNOSIS — X32XXXD Exposure to sunlight, subsequent encounter: Secondary | ICD-10-CM | POA: Diagnosis not present

## 2018-08-14 DIAGNOSIS — B078 Other viral warts: Secondary | ICD-10-CM | POA: Diagnosis not present

## 2018-09-25 DIAGNOSIS — Z125 Encounter for screening for malignant neoplasm of prostate: Secondary | ICD-10-CM | POA: Diagnosis not present

## 2018-09-25 DIAGNOSIS — N400 Enlarged prostate without lower urinary tract symptoms: Secondary | ICD-10-CM | POA: Diagnosis not present

## 2018-09-25 DIAGNOSIS — E782 Mixed hyperlipidemia: Secondary | ICD-10-CM | POA: Diagnosis not present

## 2018-09-25 DIAGNOSIS — I10 Essential (primary) hypertension: Secondary | ICD-10-CM | POA: Diagnosis not present

## 2018-09-25 DIAGNOSIS — J309 Allergic rhinitis, unspecified: Secondary | ICD-10-CM | POA: Diagnosis not present

## 2018-09-25 DIAGNOSIS — Z111 Encounter for screening for respiratory tuberculosis: Secondary | ICD-10-CM | POA: Diagnosis not present

## 2018-09-25 DIAGNOSIS — Z0000 Encounter for general adult medical examination without abnormal findings: Secondary | ICD-10-CM | POA: Diagnosis not present

## 2018-11-16 DIAGNOSIS — R351 Nocturia: Secondary | ICD-10-CM | POA: Diagnosis not present

## 2018-11-16 DIAGNOSIS — N529 Male erectile dysfunction, unspecified: Secondary | ICD-10-CM | POA: Diagnosis not present

## 2018-11-16 DIAGNOSIS — N401 Enlarged prostate with lower urinary tract symptoms: Secondary | ICD-10-CM | POA: Diagnosis not present

## 2018-11-22 DIAGNOSIS — H43812 Vitreous degeneration, left eye: Secondary | ICD-10-CM | POA: Diagnosis not present

## 2018-12-07 DIAGNOSIS — R351 Nocturia: Secondary | ICD-10-CM | POA: Diagnosis not present

## 2018-12-07 DIAGNOSIS — N529 Male erectile dysfunction, unspecified: Secondary | ICD-10-CM | POA: Diagnosis not present

## 2018-12-07 DIAGNOSIS — N3281 Overactive bladder: Secondary | ICD-10-CM | POA: Diagnosis not present

## 2018-12-07 DIAGNOSIS — H1045 Other chronic allergic conjunctivitis: Secondary | ICD-10-CM | POA: Diagnosis not present

## 2018-12-07 DIAGNOSIS — N401 Enlarged prostate with lower urinary tract symptoms: Secondary | ICD-10-CM | POA: Diagnosis not present

## 2018-12-21 DIAGNOSIS — H43812 Vitreous degeneration, left eye: Secondary | ICD-10-CM | POA: Diagnosis not present

## 2019-07-30 DIAGNOSIS — L570 Actinic keratosis: Secondary | ICD-10-CM | POA: Diagnosis not present

## 2019-07-30 DIAGNOSIS — B078 Other viral warts: Secondary | ICD-10-CM | POA: Diagnosis not present

## 2019-07-30 DIAGNOSIS — D225 Melanocytic nevi of trunk: Secondary | ICD-10-CM | POA: Diagnosis not present

## 2019-07-30 DIAGNOSIS — Z85820 Personal history of malignant melanoma of skin: Secondary | ICD-10-CM | POA: Diagnosis not present

## 2019-07-30 DIAGNOSIS — L821 Other seborrheic keratosis: Secondary | ICD-10-CM | POA: Diagnosis not present

## 2019-07-30 DIAGNOSIS — L814 Other melanin hyperpigmentation: Secondary | ICD-10-CM | POA: Diagnosis not present

## 2019-07-30 DIAGNOSIS — X32XXXD Exposure to sunlight, subsequent encounter: Secondary | ICD-10-CM | POA: Diagnosis not present

## 2019-07-30 DIAGNOSIS — Z08 Encounter for follow-up examination after completed treatment for malignant neoplasm: Secondary | ICD-10-CM | POA: Diagnosis not present

## 2019-07-30 DIAGNOSIS — Z1283 Encounter for screening for malignant neoplasm of skin: Secondary | ICD-10-CM | POA: Diagnosis not present

## 2019-08-29 DIAGNOSIS — Z23 Encounter for immunization: Secondary | ICD-10-CM | POA: Diagnosis not present

## 2019-09-12 DIAGNOSIS — Z111 Encounter for screening for respiratory tuberculosis: Secondary | ICD-10-CM | POA: Diagnosis not present

## 2019-10-18 DIAGNOSIS — Z125 Encounter for screening for malignant neoplasm of prostate: Secondary | ICD-10-CM | POA: Diagnosis not present

## 2019-10-18 DIAGNOSIS — E782 Mixed hyperlipidemia: Secondary | ICD-10-CM | POA: Diagnosis not present

## 2019-10-25 DIAGNOSIS — N400 Enlarged prostate without lower urinary tract symptoms: Secondary | ICD-10-CM | POA: Diagnosis not present

## 2019-10-25 DIAGNOSIS — J309 Allergic rhinitis, unspecified: Secondary | ICD-10-CM | POA: Diagnosis not present

## 2019-10-25 DIAGNOSIS — E782 Mixed hyperlipidemia: Secondary | ICD-10-CM | POA: Diagnosis not present

## 2019-10-25 DIAGNOSIS — Z0000 Encounter for general adult medical examination without abnormal findings: Secondary | ICD-10-CM | POA: Diagnosis not present

## 2019-10-25 DIAGNOSIS — I10 Essential (primary) hypertension: Secondary | ICD-10-CM | POA: Diagnosis not present

## 2020-02-14 DIAGNOSIS — R351 Nocturia: Secondary | ICD-10-CM | POA: Diagnosis not present

## 2020-02-14 DIAGNOSIS — N529 Male erectile dysfunction, unspecified: Secondary | ICD-10-CM | POA: Diagnosis not present

## 2020-02-14 DIAGNOSIS — N401 Enlarged prostate with lower urinary tract symptoms: Secondary | ICD-10-CM | POA: Diagnosis not present

## 2020-02-21 DIAGNOSIS — X32XXXD Exposure to sunlight, subsequent encounter: Secondary | ICD-10-CM | POA: Diagnosis not present

## 2020-02-21 DIAGNOSIS — L570 Actinic keratosis: Secondary | ICD-10-CM | POA: Diagnosis not present

## 2020-02-21 DIAGNOSIS — Z85820 Personal history of malignant melanoma of skin: Secondary | ICD-10-CM | POA: Diagnosis not present

## 2020-02-21 DIAGNOSIS — Z1283 Encounter for screening for malignant neoplasm of skin: Secondary | ICD-10-CM | POA: Diagnosis not present

## 2020-02-21 DIAGNOSIS — Z08 Encounter for follow-up examination after completed treatment for malignant neoplasm: Secondary | ICD-10-CM | POA: Diagnosis not present

## 2020-03-19 DIAGNOSIS — H903 Sensorineural hearing loss, bilateral: Secondary | ICD-10-CM | POA: Diagnosis not present

## 2020-03-25 DIAGNOSIS — L089 Local infection of the skin and subcutaneous tissue, unspecified: Secondary | ICD-10-CM | POA: Diagnosis not present

## 2020-03-25 DIAGNOSIS — L723 Sebaceous cyst: Secondary | ICD-10-CM | POA: Diagnosis not present

## 2020-03-26 DIAGNOSIS — L723 Sebaceous cyst: Secondary | ICD-10-CM | POA: Diagnosis not present

## 2020-03-26 DIAGNOSIS — L089 Local infection of the skin and subcutaneous tissue, unspecified: Secondary | ICD-10-CM | POA: Diagnosis not present

## 2020-03-31 DIAGNOSIS — H903 Sensorineural hearing loss, bilateral: Secondary | ICD-10-CM | POA: Diagnosis not present

## 2020-04-24 DIAGNOSIS — J0100 Acute maxillary sinusitis, unspecified: Secondary | ICD-10-CM | POA: Diagnosis not present

## 2020-06-09 DIAGNOSIS — Z20822 Contact with and (suspected) exposure to covid-19: Secondary | ICD-10-CM | POA: Diagnosis not present

## 2020-08-12 DIAGNOSIS — J324 Chronic pansinusitis: Secondary | ICD-10-CM | POA: Diagnosis not present

## 2020-08-12 DIAGNOSIS — J3489 Other specified disorders of nose and nasal sinuses: Secondary | ICD-10-CM | POA: Diagnosis not present

## 2020-08-12 DIAGNOSIS — J322 Chronic ethmoidal sinusitis: Secondary | ICD-10-CM | POA: Diagnosis not present

## 2020-08-12 DIAGNOSIS — J320 Chronic maxillary sinusitis: Secondary | ICD-10-CM | POA: Diagnosis not present

## 2020-10-09 DIAGNOSIS — Z08 Encounter for follow-up examination after completed treatment for malignant neoplasm: Secondary | ICD-10-CM | POA: Diagnosis not present

## 2020-10-09 DIAGNOSIS — L570 Actinic keratosis: Secondary | ICD-10-CM | POA: Diagnosis not present

## 2020-10-09 DIAGNOSIS — B078 Other viral warts: Secondary | ICD-10-CM | POA: Diagnosis not present

## 2020-10-09 DIAGNOSIS — L821 Other seborrheic keratosis: Secondary | ICD-10-CM | POA: Diagnosis not present

## 2020-10-09 DIAGNOSIS — Z1283 Encounter for screening for malignant neoplasm of skin: Secondary | ICD-10-CM | POA: Diagnosis not present

## 2020-10-09 DIAGNOSIS — Z86006 Personal history of melanoma in-situ: Secondary | ICD-10-CM | POA: Diagnosis not present

## 2020-10-09 DIAGNOSIS — X32XXXD Exposure to sunlight, subsequent encounter: Secondary | ICD-10-CM | POA: Diagnosis not present

## 2020-11-18 DIAGNOSIS — I10 Essential (primary) hypertension: Secondary | ICD-10-CM | POA: Diagnosis not present

## 2020-11-18 DIAGNOSIS — N400 Enlarged prostate without lower urinary tract symptoms: Secondary | ICD-10-CM | POA: Diagnosis not present

## 2020-11-18 DIAGNOSIS — E782 Mixed hyperlipidemia: Secondary | ICD-10-CM | POA: Diagnosis not present

## 2020-11-18 DIAGNOSIS — N529 Male erectile dysfunction, unspecified: Secondary | ICD-10-CM | POA: Diagnosis not present

## 2020-11-18 DIAGNOSIS — Z125 Encounter for screening for malignant neoplasm of prostate: Secondary | ICD-10-CM | POA: Diagnosis not present

## 2020-11-18 DIAGNOSIS — Z0000 Encounter for general adult medical examination without abnormal findings: Secondary | ICD-10-CM | POA: Diagnosis not present

## 2021-02-23 DIAGNOSIS — N529 Male erectile dysfunction, unspecified: Secondary | ICD-10-CM | POA: Diagnosis not present

## 2021-02-23 DIAGNOSIS — N401 Enlarged prostate with lower urinary tract symptoms: Secondary | ICD-10-CM | POA: Diagnosis not present

## 2021-02-23 DIAGNOSIS — R351 Nocturia: Secondary | ICD-10-CM | POA: Diagnosis not present

## 2021-04-09 DIAGNOSIS — L821 Other seborrheic keratosis: Secondary | ICD-10-CM | POA: Diagnosis not present

## 2021-04-09 DIAGNOSIS — X32XXXD Exposure to sunlight, subsequent encounter: Secondary | ICD-10-CM | POA: Diagnosis not present

## 2021-04-09 DIAGNOSIS — Z1283 Encounter for screening for malignant neoplasm of skin: Secondary | ICD-10-CM | POA: Diagnosis not present

## 2021-04-09 DIAGNOSIS — Z86006 Personal history of melanoma in-situ: Secondary | ICD-10-CM | POA: Diagnosis not present

## 2021-04-09 DIAGNOSIS — Z08 Encounter for follow-up examination after completed treatment for malignant neoplasm: Secondary | ICD-10-CM | POA: Diagnosis not present

## 2021-04-09 DIAGNOSIS — L570 Actinic keratosis: Secondary | ICD-10-CM | POA: Diagnosis not present

## 2021-05-18 DIAGNOSIS — Z23 Encounter for immunization: Secondary | ICD-10-CM | POA: Diagnosis not present

## 2021-06-16 DIAGNOSIS — R509 Fever, unspecified: Secondary | ICD-10-CM | POA: Diagnosis not present

## 2021-06-16 DIAGNOSIS — U071 COVID-19: Secondary | ICD-10-CM | POA: Diagnosis not present

## 2021-06-16 DIAGNOSIS — R059 Cough, unspecified: Secondary | ICD-10-CM | POA: Diagnosis not present

## 2021-06-16 DIAGNOSIS — R5383 Other fatigue: Secondary | ICD-10-CM | POA: Diagnosis not present

## 2021-09-01 DIAGNOSIS — R002 Palpitations: Secondary | ICD-10-CM | POA: Diagnosis not present

## 2021-10-11 DIAGNOSIS — I351 Nonrheumatic aortic (valve) insufficiency: Secondary | ICD-10-CM | POA: Insufficient documentation

## 2021-10-11 DIAGNOSIS — I10 Essential (primary) hypertension: Secondary | ICD-10-CM | POA: Insufficient documentation

## 2021-10-11 DIAGNOSIS — R002 Palpitations: Secondary | ICD-10-CM | POA: Insufficient documentation

## 2021-10-11 NOTE — Progress Notes (Signed)
Cardiology Office Note   Date:  10/13/2021   ID:  James Young, DOB Apr 29, 1956, MRN [id number]  PCP:  Tally Joe, MD  Cardiologist:   None Referring:  Tally Joe, MD  Chief Complaint  Patient presents with   Palpitations      History of Present Illness: James Young is a 65 y.o. male who is referred by Tally Joe, MD for evaluation of palpitations.  The patient presents for evaluation of palpitations.  York Spaniel this happened on October 11.  He remembers when it started.  He thought it was related to taking desmopressin which she was doing for frequent urination.  However he stopped that and it still would make a difference.  He was noticing at night.  It happens when he is sitting around night.  He feels his heart skipping.  It might last for about 2 to 4 minutes.  He has not described tacky arrhythmias.  He does not think there is a particular trigger although he drinks quite a bit of caffeine.  He has not had any presyncope or syncope.  He was walking up till this time 30 minutes a day and he could not bring it on.  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  With all of these activities he is not having any symptoms such as chest pressure, neck or arm discomfort.  He does not have any PND or orthopnea.  He has had some mild dyspnea with exertion on occasion.  He said he did have chest pain years ago.  He had a stress test many years ago that he thinks was normal.  He otherwise has not had cardiac history.  He did have an EKG at a walk-in clinic and it was normal but I do not have this tracing.  He brings some tracings from his own wearable and he does have suggestion of PACs although there is significant artifact on this.   Past Medical History:  Diagnosis Date   BPV (benign positional vertigo)    Hyperlipidemia    Hypertension     Past Surgical History:  Procedure Laterality Date   CHOLECYSTECTOMY     CYSTECTOMY     back     Current Outpatient Medications   Medication Sig Dispense Refill   lisinopril (PRINIVIL,ZESTRIL) 10 MG tablet Take 10 mg by mouth daily.   0   lovastatin (MEVACOR) 20 MG tablet Take 20 mg by mouth daily.  0   Omega-3 Fatty Acids (FISH OIL PO) Take 1 tablet by mouth daily.     No current facility-administered medications for this visit.    Allergies:   Patient has no known allergies.    Social History:  The patient  reports that he has never smoked. He does not have any smokeless tobacco history on file. He reports current alcohol use. He reports that he does not use drugs.   Family History:  The patient's family history includes Bipolar disorder in his mother; Bladder Cancer in his brother and father; COPD in his mother; Heart attack (age of onset: 46) in his maternal grandfather.    ROS:  Please see the history of present illness.   Otherwise, review of systems are positive for none.   All other systems are reviewed and negative.    PHYSICAL EXAM: VS:  BP 120/78   Pulse 64   Ht 6' (1.829 m)   Wt 219 lb (99.3 kg)   SpO2 98%   BMI  29.70 kg/m  , BMI Body mass index is 29.7 kg/m. GENERAL:  Well appearing HEENT:  Pupils equal round and reactive, fundi not visualized, oral mucosa unremarkable NECK:  No jugular venous distention, waveform within normal limits, carotid upstroke brisk and symmetric, no bruits, no thyromegaly LYMPHATICS:  No cervical, inguinal adenopathy LUNGS:  Clear to auscultation bilaterally BACK:  No CVA tenderness CHEST:  Unremarkable HEART:  PMI not displaced or sustained,S1 and S2 within normal limits, no S3, no S4, no clicks, no rubs, no murmurs ABD:  Flat, positive bowel sounds normal in frequency in pitch, no bruits, no rebound, no guarding, no midline pulsatile mass, no hepatomegaly, no splenomegaly EXT:  2 plus pulses throughout, no edema, no cyanosis no clubbing SKIN:  No rashes no nodules NEURO:  Cranial nerves II through XII grossly intact, motor grossly intact throughout PSYCH:   Cognitively intact, oriented to person place and time    EKG:  EKG is not ordered today. The ekg ordered today demonstrates sinus rhythm, rate 64, axis within normal limits, intervals within normal limits, no acute ST-T wave changes.   Recent Labs: No results found for requested labs within last 8760 hours.    Lipid Panel No results found for: CHOL, TRIG, HDL, CHOLHDL, VLDL, LDLCALC, LDLDIRECT    Wt Readings from Last 3 Encounters:  10/13/21 219 lb (99.3 kg)  04/24/15 217 lb (98.4 kg)      Other studies Reviewed: Additional studies/ records that were reviewed today include: Primary office records.  Home event monitor Review of the above records demonstrates:  Please see elsewhere in the note.     ASSESSMENT AND PLAN:  PALPITATIONS: I think he is most likely having just PACs that I see reported but I like to order a 3-day Zio patch to further evaluate his rhythm.  Most likely he can manage this conservatively.  He would consider cutting out caffeine.  Further management will be based on these results.  HTN: His blood pressure is controlled.  He will continue the meds as listed.    DYSLIPIDEMIA: He is currently on a statin.  He is total cholesterol is 190, HDL 37 and LDL 100 earlier this year.  Goals of therapy will be based on the study below.  RISK REDUCTION: Given his shortness of breath, his family history and question whether he needs a statin or not I think a coronary calcium score would be very helpful in this situation.   Current medicines are reviewed at length with the patient today.  The patient does not have concerns regarding medicines.  The following changes have been made:  no change  Labs/ tests ordered today include:   Orders Placed This Encounter  Procedures   CT CARDIAC SCORING (SELF PAY ONLY)   TSH   Magnesium   Basic metabolic panel   LONG TERM MONITOR (3-14 DAYS)   EKG 12-Lead    Disposition:   FU with me as needed.      Signed, Rollene Rotunda, MD  10/13/2021 4:12 PM    [facility]

## 2021-10-13 ENCOUNTER — Ambulatory Visit (INDEPENDENT_AMBULATORY_CARE_PROVIDER_SITE_OTHER): Payer: Medicare Other

## 2021-10-13 ENCOUNTER — Encounter: Payer: Self-pay | Admitting: Cardiology

## 2021-10-13 ENCOUNTER — Other Ambulatory Visit: Payer: Self-pay

## 2021-10-13 ENCOUNTER — Ambulatory Visit: Payer: Medicare Other | Admitting: Cardiology

## 2021-10-13 VITALS — BP 120/78 | HR 64 | Ht 72.0 in | Wt 219.0 lb

## 2021-10-13 DIAGNOSIS — I10 Essential (primary) hypertension: Secondary | ICD-10-CM | POA: Diagnosis not present

## 2021-10-13 DIAGNOSIS — I351 Nonrheumatic aortic (valve) insufficiency: Secondary | ICD-10-CM

## 2021-10-13 DIAGNOSIS — R002 Palpitations: Secondary | ICD-10-CM

## 2021-10-13 DIAGNOSIS — R0602 Shortness of breath: Secondary | ICD-10-CM | POA: Diagnosis not present

## 2021-10-13 NOTE — Progress Notes (Unsigned)
Enrolled patient for a 3 day Zio XT monitor to be mailed to patients home  

## 2021-10-13 NOTE — Patient Instructions (Addendum)
Medication Instructions:  Your Physician recommend you continue on your current medication as directed.    *If you need a refill on your cardiac medications before your next appointment, please call your pharmacy*   Lab Work: Today Magnesuim, tsh, bmet If you have labs (blood work) drawn today and your tests are completely normal, you will receive your results only by: MyChart Message (if you have MyChart) OR A paper copy in the mail If you have any lab test that is abnormal or we need to change your treatment, we will call you to review the results.   Testing/Procedures: James Young- Long Term Monitor Instructions  Your physician has requested you wear a ZIO patch monitor for 3 days.  This is a single patch monitor. [employer] supplies one patch monitor per enrollment. Additional stickers are not available. Please do not apply patch if you will be having a Nuclear Stress Test,  Echocardiogram, Cardiac CT, MRI, or Chest Xray during the period you would be wearing the  monitor. The patch cannot be worn during these tests. You cannot remove and re-apply the  ZIO XT patch monitor.  Your ZIO patch monitor will be mailed 3 day USPS to your address on file. It may take 3-5 days  to receive your monitor after you have been enrolled.  Once you have received your monitor, please review the enclosed instructions. Your monitor  has already been registered assigning a specific monitor serial # to you.  Billing and Patient Assistance Program Information  We have supplied [employer] with any of your insurance information on file for billing purposes. [employer] offers a sliding scale Patient Assistance Program for patients that do not have  insurance, or whose insurance does not completely cover the cost of the ZIO monitor.  You must apply for the Patient Assistance Program to qualify for this discounted rate.  To apply, please call [employer] at [phone], select option 4, select option 2, ask to apply for   Patient Assistance Program. [name] will ask your household income, and how many people  are in your household. They will quote your out-of-pocket cost based on that information.  [employer] will also be able to set up a 76-month, interest-free payment plan if needed.  Applying the monitor   Shave hair from upper left chest.  Hold abrader disc by orange tab. Rub abrader in 40 strokes over the upper left chest as  indicated in your monitor instructions.  Clean area with 4 enclosed alcohol pads. Let dry.  Apply patch as indicated in monitor instructions. Patch will be placed under collarbone on left  side of chest with arrow pointing upward.  Rub patch adhesive wings for 2 minutes. Remove white label marked "1". Remove the white  label marked "2". Rub patch adhesive wings for 2 additional minutes.  While looking in a mirror, press and release button in center of patch. A small green light will  flash 3-4 times. This will be your only indicator that the monitor has been turned on.  Do not shower for the first 24 hours. You may shower after the first 24 hours.  Press the button if you feel a symptom. You will hear a small click. Record Date, Time and  Symptom in the Patient Logbook.  When you are ready to remove the patch, follow instructions on the last 2 pages of Patient  Logbook. Stick patch monitor onto the last page of Patient Logbook.  Place Patient Logbook in the blue and white box. Use locking  tab on box and tape box closed  securely. The blue and white box has prepaid postage on it. Please place it in the mailbox as  soon as possible. Your physician should have your test results approximately 7 days after the  monitor has been mailed back to [facility].  Call [facility] Customer Care at [phone] if you have questions regarding  your ZIO XT patch monitor. Call them immediately if you see an orange light blinking on your  monitor.  If your monitor falls off in less than 4  days, contact our Monitor department at [phone].  If your monitor becomes loose or falls off after 4 days call [employer] at [phone] for  suggestions on securing your monitor  Dr. Daiva Nakayama, MD has ordered a CT coronary calcium score. This test is done at [address]. This is $99 out of pocket.   Coronary CalciumScan A coronary calcium scan is an imaging test used to look for deposits of calcium and other fatty materials (plaques) in the inner lining of the blood vessels of the heart (coronary arteries). These deposits of calcium and plaques can partly clog and narrow the coronary arteries without producing any symptoms or warning signs. This puts a person at risk for a heart attack. This test can detect these deposits before symptoms develop. Tell a health care provider about: Any allergies you have. All medicines you are taking, including vitamins, herbs, eye drops, creams, and over-the-counter medicines. Any problems you or family members have had with anesthetic medicines. Any blood disorders you have. Any surgeries you have had. Any medical conditions you have. Whether you are pregnant or may be pregnant. What are the risks? Generally, this is a safe procedure. However, problems may occur, including: Harm to a pregnant woman and her unborn baby. This test involves the use of radiation. Radiation exposure can be dangerous to a pregnant woman and her unborn baby. If you are pregnant, you generally should not have this procedure done. Slight increase in the risk of cancer. This is because of the radiation involved in the test. What happens before the procedure? No preparation is needed for this procedure. What happens during the procedure? You will undress and remove any jewelry around your neck or chest. You will put on a hospital gown. Sticky electrodes will be placed on your chest. The electrodes will be connected to an electrocardiogram (ECG) machine to  record a tracing of the electrical activity of your heart. A CT scanner will take pictures of your heart. During this time, you will be asked to lie still and hold your breath for 2-3 seconds while a picture of your heart is being taken. The procedure may vary among health care providers and hospitals. What happens after the procedure? You can get dressed. You can return to your normal activities. It is up to you to get the results of your test. Ask your health care provider, or the department that is doing the test, when your results will be ready. Summary A coronary calcium scan is an imaging test used to look for deposits of calcium and other fatty materials (plaques) in the inner lining of the blood vessels of the heart (coronary arteries). Generally, this is a safe procedure. Tell your health care provider if you are pregnant or may be pregnant. No preparation is needed for this procedure. A CT scanner will take pictures of your heart. You can return to your normal activities after the scan is done.  This information is not intended to replace advice given to you by your health care provider. Make sure you discuss any questions you have with your health care provider. Document Released: 04/22/2008 Document Revised: 09/13/2016 Document Reviewed: 09/13/2016 Elsevier Interactive Patient Education  2017 [employer].    Follow-Up: At [facility], you and your health needs are our priority.  As part of our continuing mission to provide you with exceptional heart care, we have created designated Provider Care Teams.  These Care Teams include your primary Cardiologist (physician) and Advanced Practice Providers (APPs -  Physician Assistants and Nurse Practitioners) who all work together to provide you with the care you need, when you need it.  We recommend signing up for the patient portal called "MyChart".  Sign up information is provided on this After Visit Summary.  MyChart is used to  connect with patients for Virtual Visits (Telemedicine).  Patients are able to view lab/test results, encounter notes, upcoming appointments, etc.  Non-urgent messages can be sent to your provider as well.   To learn more about what you can do with MyChart, go to [email or web address].    Your next appointment:    As needed.  The format for your next appointment:   In Person  Provider:   Dr. Daiva Nakayama, MD

## 2021-10-14 LAB — BASIC METABOLIC PANEL
BUN/Creatinine Ratio: 29 — ABNORMAL HIGH (ref 10–24)
BUN: 24 mg/dL (ref 8–27)
CO2: 25 mmol/L (ref 20–29)
Calcium: 9.9 mg/dL (ref 8.6–10.2)
Chloride: 99 mmol/L (ref 96–106)
Creatinine, Ser: 0.84 mg/dL (ref 0.76–1.27)
Glucose: 86 mg/dL (ref 70–99)
Potassium: 4.8 mmol/L (ref 3.5–5.2)
Sodium: 138 mmol/L (ref 134–144)
eGFR: 96 mL/min/{1.73_m2} (ref >59)

## 2021-10-14 LAB — MAGNESIUM: Magnesium: 2.3 mg/dL (ref 1.6–2.3)

## 2021-10-14 LAB — TSH: TSH: 3.04 u[IU]/mL (ref 0.450–4.500)

## 2021-10-16 DIAGNOSIS — R0602 Shortness of breath: Secondary | ICD-10-CM | POA: Diagnosis not present

## 2021-10-16 DIAGNOSIS — I351 Nonrheumatic aortic (valve) insufficiency: Secondary | ICD-10-CM | POA: Diagnosis not present

## 2021-10-16 DIAGNOSIS — I10 Essential (primary) hypertension: Secondary | ICD-10-CM | POA: Diagnosis not present

## 2021-10-16 DIAGNOSIS — R002 Palpitations: Secondary | ICD-10-CM

## 2021-10-19 ENCOUNTER — Encounter: Payer: Self-pay | Admitting: *Deleted

## 2021-10-27 DIAGNOSIS — R0602 Shortness of breath: Secondary | ICD-10-CM | POA: Diagnosis not present

## 2021-10-27 DIAGNOSIS — R002 Palpitations: Secondary | ICD-10-CM | POA: Diagnosis not present

## 2021-10-27 DIAGNOSIS — I351 Nonrheumatic aortic (valve) insufficiency: Secondary | ICD-10-CM | POA: Diagnosis not present

## 2021-10-27 DIAGNOSIS — I10 Essential (primary) hypertension: Secondary | ICD-10-CM | POA: Diagnosis not present

## 2021-12-03 ENCOUNTER — Other Ambulatory Visit: Payer: Self-pay

## 2021-12-03 ENCOUNTER — Ambulatory Visit (INDEPENDENT_AMBULATORY_CARE_PROVIDER_SITE_OTHER)
Admission: RE | Admit: 2021-12-03 | Discharge: 2021-12-03 | Disposition: A | Discharge status: Home / Self Care | Payer: Self-pay | Source: Ambulatory Visit | Attending: Cardiology | Admitting: Cardiology

## 2021-12-03 DIAGNOSIS — I351 Nonrheumatic aortic (valve) insufficiency: Secondary | ICD-10-CM

## 2021-12-03 DIAGNOSIS — R002 Palpitations: Secondary | ICD-10-CM

## 2021-12-03 DIAGNOSIS — R0602 Shortness of breath: Secondary | ICD-10-CM

## 2021-12-03 DIAGNOSIS — I10 Essential (primary) hypertension: Secondary | ICD-10-CM

## 2021-12-04 ENCOUNTER — Telehealth: Payer: Self-pay | Admitting: *Deleted

## 2021-12-04 NOTE — Telephone Encounter (Signed)
Left message to call back  

## 2021-12-04 NOTE — Telephone Encounter (Signed)
-----   Message from Rollene Rotunda, MD sent at 12/04/2021 12:07 PM EST ----- He does have elevated coronary calcium.  I think it would be prudent to screen him with a POET (Plain Old Exercise Treadmill).  In addition I would discontinue the Mevacor and start Lipitor 40 mg daily.  Call Mr. Crookshanks with the results and send results to Tally Joe, MD

## 2021-12-07 ENCOUNTER — Other Ambulatory Visit: Payer: Self-pay

## 2021-12-07 DIAGNOSIS — E782 Mixed hyperlipidemia: Secondary | ICD-10-CM | POA: Diagnosis not present

## 2021-12-07 DIAGNOSIS — I10 Essential (primary) hypertension: Secondary | ICD-10-CM | POA: Diagnosis not present

## 2021-12-07 DIAGNOSIS — Z1159 Encounter for screening for other viral diseases: Secondary | ICD-10-CM | POA: Diagnosis not present

## 2021-12-07 DIAGNOSIS — R002 Palpitations: Secondary | ICD-10-CM | POA: Diagnosis not present

## 2021-12-07 DIAGNOSIS — R931 Abnormal findings on diagnostic imaging of heart and coronary circulation: Secondary | ICD-10-CM

## 2021-12-07 DIAGNOSIS — Z0000 Encounter for general adult medical examination without abnormal findings: Secondary | ICD-10-CM | POA: Diagnosis not present

## 2021-12-07 DIAGNOSIS — Z125 Encounter for screening for malignant neoplasm of prostate: Secondary | ICD-10-CM | POA: Diagnosis not present

## 2021-12-07 MED ORDER — ATORVASTATIN CALCIUM 40 MG PO TABS: 40.0000 mg | ORAL_TABLET | Freq: Every day | ORAL | 3 refills | Status: DC

## 2021-12-07 NOTE — Telephone Encounter (Signed)
Patient returning call.

## 2021-12-07 NOTE — Telephone Encounter (Signed)
Spoke with patient and gave him the calcium scoring result. He voiced understanding of stopping mevacor, starting lipitor 40 mg each day, and being scheduled for an exercise stress test (he will be called to schedule). Orders placed. Attestation needs to be signed.

## 2021-12-07 NOTE — Telephone Encounter (Signed)
Routed calcium scoring results to Dr. Antony Contras.

## 2021-12-09 ENCOUNTER — Other Ambulatory Visit: Payer: Self-pay | Admitting: Family Medicine

## 2021-12-09 DIAGNOSIS — Z136 Encounter for screening for cardiovascular disorders: Secondary | ICD-10-CM

## 2021-12-10 ENCOUNTER — Telehealth (HOSPITAL_COMMUNITY): Payer: Self-pay | Admitting: *Deleted

## 2021-12-10 NOTE — Telephone Encounter (Signed)
Close encounter 

## 2021-12-11 ENCOUNTER — Other Ambulatory Visit: Payer: Self-pay

## 2021-12-11 ENCOUNTER — Other Ambulatory Visit: Payer: Self-pay | Admitting: *Deleted

## 2021-12-11 ENCOUNTER — Ambulatory Visit (HOSPITAL_COMMUNITY)
Admission: RE | Admit: 2021-12-11 | Discharge: 2021-12-11 | Disposition: A | Discharge status: Home / Self Care | Payer: Medicare Other | Source: Ambulatory Visit | Attending: Cardiology | Admitting: Cardiology

## 2021-12-11 DIAGNOSIS — R931 Abnormal findings on diagnostic imaging of heart and coronary circulation: Secondary | ICD-10-CM | POA: Diagnosis not present

## 2021-12-11 LAB — EXERCISE TOLERANCE TEST
Angina Index: 0
Duke Treadmill Score: 5
Estimated workload: 11.3
Exercise duration (min): 9 min
Exercise duration (sec): 48 s
MPHR: 154 {beats}/min
Peak HR: 150 {beats}/min
Percent HR: 97 %
Rest HR: 60 {beats}/min
ST Depression (mm): 1 mm

## 2021-12-14 ENCOUNTER — Other Ambulatory Visit: Payer: Self-pay

## 2021-12-14 DIAGNOSIS — R931 Abnormal findings on diagnostic imaging of heart and coronary circulation: Secondary | ICD-10-CM

## 2021-12-24 ENCOUNTER — Ambulatory Visit
Admission: RE | Admit: 2021-12-24 | Discharge: 2021-12-24 | Disposition: A | Discharge status: Home / Self Care | Payer: Medicare Other | Source: Ambulatory Visit | Attending: Family Medicine | Admitting: Family Medicine

## 2021-12-24 DIAGNOSIS — Z136 Encounter for screening for cardiovascular disorders: Secondary | ICD-10-CM | POA: Diagnosis not present

## 2021-12-24 DIAGNOSIS — Z87891 Personal history of nicotine dependence: Secondary | ICD-10-CM | POA: Diagnosis not present

## 2022-01-04 DIAGNOSIS — M25511 Pain in right shoulder: Secondary | ICD-10-CM | POA: Diagnosis not present

## 2022-01-29 ENCOUNTER — Inpatient Hospital Stay: Payer: Medicare Other | Attending: Licensed Clinical Social Worker | Admitting: Licensed Clinical Social Worker

## 2022-01-29 ENCOUNTER — Other Ambulatory Visit: Payer: Self-pay

## 2022-01-29 DIAGNOSIS — R931 Abnormal findings on diagnostic imaging of heart and coronary circulation: Secondary | ICD-10-CM

## 2022-01-29 NOTE — Progress Notes (Signed)
[place] Advance Directives ?Clinical Social Work ? ?Patient presented to [facility]  to review and complete healthcare advance directives.  Clinical Social Worker met with patient and spouse.  The patient designated Torin Modica as their primary healthcare agent and did not name a secondary agent.  Patient also completed healthcare living will.   ? ?Documents were notarized and copies made for patient/family. Clinical Social Worker will send documents to medical records to be scanned into patient's chart. ?Clinical Social Worker encouraged patient/family to contact with any additional questions or concerns. ? ? ?Sohum Delillo E Luis Sami, LCSW ?Clinical Social Worker ?[place]      ? ?

## 2022-02-15 DIAGNOSIS — M7541 Impingement syndrome of right shoulder: Secondary | ICD-10-CM | POA: Diagnosis not present

## 2022-02-25 ENCOUNTER — Ambulatory Visit: Payer: Medicare Other | Admitting: Cardiology

## 2022-03-05 ENCOUNTER — Ambulatory Visit: Payer: Medicare Other | Admitting: Cardiology

## 2022-03-09 DIAGNOSIS — R931 Abnormal findings on diagnostic imaging of heart and coronary circulation: Secondary | ICD-10-CM | POA: Diagnosis not present

## 2022-03-09 LAB — LIPID PANEL
Chol/HDL Ratio: 3.8 ratio (ref 0.0–5.0)
Cholesterol, Total: 129 mg/dL (ref 100–199)
HDL: 34 mg/dL — ABNORMAL LOW (ref >39)
LDL Chol Calc (NIH): 62 mg/dL (ref 0–99)
Triglycerides: 200 mg/dL — ABNORMAL HIGH (ref 0–149)
VLDL Cholesterol Cal: 33 mg/dL (ref 5–40)

## 2022-03-11 ENCOUNTER — Ambulatory Visit: Payer: Medicare Other | Admitting: Cardiology

## 2022-03-18 ENCOUNTER — Ambulatory Visit: Payer: Medicare Other | Admitting: Cardiology

## 2022-03-31 DIAGNOSIS — E785 Hyperlipidemia, unspecified: Secondary | ICD-10-CM | POA: Insufficient documentation

## 2022-03-31 NOTE — Progress Notes (Unsigned)
Cardiology Office Note   Date:  04/01/2022   ID:  James Young, DOB 1956/05/18, MRN [id number]  PCP:  Tally Joe, MD  Cardiologist:   Rollene Rotunda, MD Referring:  Tally Joe, MD   Chief Complaint  Patient presents with   Coronary Artery Disease      History of Present Illness: James Young is a 66 y.o. male who is referred by Tally Joe, MD for evaluation of palpitations.  He had a cardiac score that was 773 and 87th percentile. He had a negative adequate POET (Plain Old Exercise Treadmill).  He was to also wear a monitor.  He stopped drinking caffeine and his palpitations improved.  He had some SVT on the monitor last year.  Since I saw him he has done well.  He exercises 4 days a week.  Some of this is interval training.  He gets his heart rate up. The patient denies any new symptoms such as chest discomfort, neck or arm discomfort. There has been no new shortness of breath, PND or orthopnea. There have been no reported palpitations, presyncope or syncope. He brings some tracings from his own wearable and he does have suggestion of PACs although there is significant artifact on this.   Past Medical History:  Diagnosis Date   BPV (benign positional vertigo)    Hyperlipidemia    Hypertension     Past Surgical History:  Procedure Laterality Date   CHOLECYSTECTOMY     CYSTECTOMY     back     Current Outpatient Medications  Medication Sig Dispense Refill   atorvastatin (LIPITOR) 40 MG tablet Take 1 tablet (40 mg total) by mouth daily. 90 tablet 3   lisinopril (PRINIVIL,ZESTRIL) 10 MG tablet Take 10 mg by mouth daily.   0   Omega-3 Fatty Acids (FISH OIL PO) Take 1 tablet by mouth daily.     No current facility-administered medications for this visit.    Allergies:   Patient has no known allergies.    ROS:  Please see the history of present illness.   Otherwise, review of systems are positive for none.   All other systems are reviewed and negative.     PHYSICAL EXAM: VS:  BP 108/70   Pulse 72   Ht 6' (1.829 m)   Wt 221 lb 3.2 oz (100.3 kg)   SpO2 96%   BMI 30.00 kg/m  , BMI Body mass index is 30 kg/m. GENERAL:  Well appearing NECK:  No jugular venous distention, waveform within normal limits, carotid upstroke brisk and symmetric, no bruits, no thyromegaly LUNGS:  Clear to auscultation bilaterally CHEST:  Unremarkable HEART:  PMI not displaced or sustained,S1 and S2 within normal limits, no S3, no S4, no clicks, no rubs, no murmurs ABD:  Flat, positive bowel sounds normal in frequency in pitch, no bruits, no rebound, no guarding, no midline pulsatile mass, no hepatomegaly, no splenomegaly EXT:  2 plus pulses throughout, no edema, no cyanosis no clubbing   EKG:  EKG is not ordered today.   Recent Labs: 10/13/2021: BUN 24; Creatinine, Ser 0.84; Magnesium 2.3; Potassium 4.8; Sodium 138; TSH 3.040    Lipid Panel    Component Value Date/Time   CHOL 129 03/09/2022 0917   TRIG 200 (H) 03/09/2022 0917   HDL 34 (L) 03/09/2022 0917   CHOLHDL 3.8 03/09/2022 0917   LDLCALC 62 03/09/2022 0917      Wt Readings from Last 3 Encounters:  04/01/22 221 lb 3.2 oz (  100.3 kg)  10/13/21 219 lb (99.3 kg)  04/24/15 217 lb (98.4 kg)      Other studies Reviewed: Additional studies/ records that were reviewed today include: Labs Review of the above records demonstrates:  Please see elsewhere in the note.     ASSESSMENT AND PLAN:  PALPITATIONS: This palpitations are much improved with the reduced caffeine.  No change in therapy.   HTN: Blood pressures controlled.  Continue the meds as listed.  DYSLIPIDEMIA: He is LDL is excellent.  He is tolerating the change to Lipitor.  No change in therapy.   ELEVATED CORONARY CALCIUM: He had no high risk findings on stress testing.  We went over this in detail.  He had an excellent exercise tolerance.  There was some none diagnostic upsloping ST depression that I would not suggest is positive.   He has vigorous exercise.  We talked about symptoms that might indicate a need to do further testing.  We talked at great length about primary risk reduction.  No further testing at this time.   I will repeat a POET (Plain Old Exercise Treadmill) next year  Current medicines are reviewed at length with the patient today.  The patient does not have concerns regarding medicines.  The following changes have been made:  no change  Labs/ tests ordered today include:   Orders Placed This Encounter  Procedures   EXERCISE TOLERANCE TEST (ETT)    Disposition:   FU with me as needed.      Signed, Rollene Rotunda, MD  04/01/2022 3:58 PM    [facility]

## 2022-04-01 ENCOUNTER — Encounter: Payer: Self-pay | Admitting: Cardiology

## 2022-04-01 ENCOUNTER — Ambulatory Visit: Payer: Medicare Other | Admitting: Cardiology

## 2022-04-01 VITALS — BP 108/70 | HR 72 | Ht 72.0 in | Wt 221.2 lb

## 2022-04-01 DIAGNOSIS — E785 Hyperlipidemia, unspecified: Secondary | ICD-10-CM

## 2022-04-01 DIAGNOSIS — I10 Essential (primary) hypertension: Secondary | ICD-10-CM | POA: Diagnosis not present

## 2022-04-01 DIAGNOSIS — R931 Abnormal findings on diagnostic imaging of heart and coronary circulation: Secondary | ICD-10-CM | POA: Diagnosis not present

## 2022-04-01 DIAGNOSIS — R002 Palpitations: Secondary | ICD-10-CM | POA: Diagnosis not present

## 2022-04-01 NOTE — Patient Instructions (Addendum)
Medication Instructions:  nochanges  *If you need a refill on your cardiac medications before your next appointment, please call your pharmacy*   Lab Work: Not needed    Testing/Procedures: Will be schedule at [address] Your physician has requested that you have a lexiscan myoview. Please follow instruction sheet, as given.    Follow-Up: At [facility], you and your health needs are our priority.  As part of our continuing mission to provide you with exceptional heart care, we have created designated Provider Care Teams.  These Care Teams include your primary Cardiologist (physician) and Advanced Practice Providers (APPs -  Physician Assistants and Nurse Practitioners) who all work together to provide you with the care you need, when you need it.     Your next appointment:   9 month(s) feb after GXT  The format for your next appointment:   In Person  Provider:   Rollene Rotunda, MD    Other Instructions   [id number]   Your doctor has scheduled you for a Myocardial Perfusion scan will  obtain information about the blood flow to your heart. The test consists of taking pictures of your heart in two phases: while resting and after a stress test.  The stress test may involve walking on a treadmill, or if you are unable to exercise adequately, you will be given a drug intended to have a similar effect on the heart to that of exercise.  The test will take approximately 3 to 4  hours to complete.  If you are pregnant or breastfeeding,  please notify the staff prior to your test.  How to prepare for your test: Do not eat or drink 2 hours prior to your test Do not consume products containing caffeine 12 hours prior to your test (examples: coffee (regular OR decaf), chocolate, sodas, tea) Your doctor may need you to hold certain medications prior to the test.  If so, these are listed below and should not be taken for 24 hours prior to the test.  If not listed below,  you may take your medications as normal.  You may resume taking held medications on your normal schedule once the test is complete.   Meds to hold: not needed Do bring a list of your current medications with you.  If you have held any meds in preparation for the test, please bring them, as you may be required to take them once the test is completed. Do wear comfortable clothes and walking shoes.  Do not wear dresses or overalls. Do NOT wear cologne, perfume, aftershave, or fragranced lotions the day of your test (deodorants okay). If these instructions are not followed your test will have to be rescheduled.   A nuclear cardiologist will review your test, prepare a report and send it to your physician.   If you have questions or concerns about your appointment, you can call the Nuclear Cardiology department at [phone]. If you cannot keep your appointment, please provide 48 hours notification to avoid a possible $50.00 charge to your account.   Please arrive 15 minutes prior to your appointment time for registration and insurance purposes

## 2022-05-20 DIAGNOSIS — L821 Other seborrheic keratosis: Secondary | ICD-10-CM | POA: Diagnosis not present

## 2022-05-20 DIAGNOSIS — Z1283 Encounter for screening for malignant neoplasm of skin: Secondary | ICD-10-CM | POA: Diagnosis not present

## 2022-05-20 DIAGNOSIS — Z86006 Personal history of melanoma in-situ: Secondary | ICD-10-CM | POA: Diagnosis not present

## 2022-05-20 DIAGNOSIS — Z08 Encounter for follow-up examination after completed treatment for malignant neoplasm: Secondary | ICD-10-CM | POA: Diagnosis not present

## 2022-11-25 ENCOUNTER — Other Ambulatory Visit: Payer: Self-pay | Admitting: Cardiology

## 2022-12-09 DIAGNOSIS — E782 Mixed hyperlipidemia: Secondary | ICD-10-CM | POA: Diagnosis not present

## 2022-12-09 DIAGNOSIS — Z0000 Encounter for general adult medical examination without abnormal findings: Secondary | ICD-10-CM | POA: Diagnosis not present

## 2022-12-09 DIAGNOSIS — R002 Palpitations: Secondary | ICD-10-CM | POA: Diagnosis not present

## 2022-12-09 DIAGNOSIS — I10 Essential (primary) hypertension: Secondary | ICD-10-CM | POA: Diagnosis not present

## 2022-12-09 DIAGNOSIS — Z125 Encounter for screening for malignant neoplasm of prostate: Secondary | ICD-10-CM | POA: Diagnosis not present

## 2022-12-09 DIAGNOSIS — Z1331 Encounter for screening for depression: Secondary | ICD-10-CM | POA: Diagnosis not present

## 2022-12-09 DIAGNOSIS — N529 Male erectile dysfunction, unspecified: Secondary | ICD-10-CM | POA: Diagnosis not present

## 2022-12-14 ENCOUNTER — Telehealth (HOSPITAL_COMMUNITY): Payer: Self-pay | Admitting: *Deleted

## 2022-12-14 NOTE — Telephone Encounter (Signed)
Pt reached and given instructions for ETT scheduled on 12/16/22.

## 2022-12-16 ENCOUNTER — Encounter (HOSPITAL_COMMUNITY): Payer: Medicare Other

## 2022-12-16 ENCOUNTER — Ambulatory Visit (HOSPITAL_COMMUNITY): Payer: Medicare Other | Attending: Cardiology

## 2022-12-16 DIAGNOSIS — I10 Essential (primary) hypertension: Secondary | ICD-10-CM

## 2022-12-16 DIAGNOSIS — R002 Palpitations: Secondary | ICD-10-CM

## 2022-12-16 LAB — EXERCISE TOLERANCE TEST
Angina Index: 0
Duke Treadmill Score: -5
Estimated workload: 11.8
Exercise duration (min): 10 min
Exercise duration (sec): 4 s
MPHR: 153 {beats}/min
Peak HR: 155 {beats}/min
Percent HR: 101 %
RPE: 17
Rest HR: 55 {beats}/min
ST Depression (mm): 3 mm

## 2022-12-23 ENCOUNTER — Encounter: Payer: Self-pay | Admitting: *Deleted

## 2022-12-23 ENCOUNTER — Telehealth: Payer: Self-pay | Admitting: *Deleted

## 2022-12-23 DIAGNOSIS — R931 Abnormal findings on diagnostic imaging of heart and coronary circulation: Secondary | ICD-10-CM

## 2022-12-23 MED ORDER — METOPROLOL TARTRATE 25 MG PO TABS: ORAL_TABLET | ORAL | 0 refills | Status: DC

## 2022-12-23 NOTE — Telephone Encounter (Signed)
-----   Message from Minus Breeding, MD sent at 12/23/2022  2:20 PM EST ----- The patient has significantly elevated coronary calcium.  He had abnormal findings on stress testing suggestive of ischemia.  Coronary angiography is indicated.  I would like to order a coronary CTA.  Discussed results with the patient.  Please send results to Antony Contras, MD

## 2022-12-23 NOTE — Telephone Encounter (Signed)
Spoke with pt, instructions discussed with patient and sent to him via my chart.

## 2022-12-30 ENCOUNTER — Telehealth (HOSPITAL_COMMUNITY): Payer: Self-pay | Admitting: *Deleted

## 2022-12-30 NOTE — Telephone Encounter (Signed)
Reaching out to patient to offer assistance regarding upcoming cardiac imaging study; pt verbalizes understanding of appt date/time, parking situation and where to check in, pre-test NPO status and medications ordered, and verified current allergies; name and call back number provided for further questions should they arise  Gordy Clement RN Navigator Cardiac Imaging [facility] [phone] office [phone] cell  Patient to hold his lisinopril and take 61m metoprolol tartrate two hours prior to his cardiac CT scan. He is aware to arrive at 10:30am.

## 2022-12-30 NOTE — Telephone Encounter (Signed)
Attempted to call patient regarding upcoming cardiac CT appointment. °Left message on voicemail with name and callback number ° °Kaithlyn Teagle RN Navigator Cardiac Imaging °[facility] °[phone] Office °[phone] Cell ° °

## 2022-12-31 ENCOUNTER — Ambulatory Visit (HOSPITAL_COMMUNITY)
Admission: RE | Admit: 2022-12-31 | Discharge: 2022-12-31 | Disposition: A | Discharge status: Home / Self Care | Payer: Medicare Other | Source: Ambulatory Visit | Attending: Cardiology | Admitting: Cardiology

## 2022-12-31 ENCOUNTER — Other Ambulatory Visit: Payer: Self-pay | Admitting: Internal Medicine

## 2022-12-31 ENCOUNTER — Ambulatory Visit (HOSPITAL_BASED_OUTPATIENT_CLINIC_OR_DEPARTMENT_OTHER)
Admission: RE | Admit: 2022-12-31 | Discharge: 2022-12-31 | Disposition: A | Discharge status: Home / Self Care | Payer: Medicare Other | Source: Ambulatory Visit | Attending: Internal Medicine | Admitting: Internal Medicine

## 2022-12-31 ENCOUNTER — Encounter: Payer: Self-pay | Admitting: Cardiology

## 2022-12-31 DIAGNOSIS — I2510 Atherosclerotic heart disease of native coronary artery without angina pectoris: Secondary | ICD-10-CM

## 2022-12-31 DIAGNOSIS — R931 Abnormal findings on diagnostic imaging of heart and coronary circulation: Secondary | ICD-10-CM | POA: Insufficient documentation

## 2022-12-31 MED ORDER — IOHEXOL 350 MG/ML SOLN
75.0000 mL | Freq: Once | INTRAVENOUS | Status: AC | PRN
  Administered 2022-12-31: 75 mL via INTRAVENOUS

## 2022-12-31 MED ORDER — NITROGLYCERIN 0.4 MG SL SUBL
SUBLINGUAL_TABLET | SUBLINGUAL | Status: AC
  Filled 2022-12-31: qty 2

## 2022-12-31 MED ORDER — NITROGLYCERIN 0.4 MG SL SUBL
0.8000 mg | SUBLINGUAL_TABLET | Freq: Once | SUBLINGUAL | Status: AC
  Administered 2022-12-31: 0.8 mg via SUBLINGUAL

## 2023-01-03 ENCOUNTER — Telehealth: Payer: Self-pay | Admitting: *Deleted

## 2023-01-03 DIAGNOSIS — R931 Abnormal findings on diagnostic imaging of heart and coronary circulation: Secondary | ICD-10-CM

## 2023-01-03 DIAGNOSIS — E785 Hyperlipidemia, unspecified: Secondary | ICD-10-CM

## 2023-01-03 MED ORDER — ATORVASTATIN CALCIUM 80 MG PO TABS: 80.0000 mg | ORAL_TABLET | Freq: Every day | ORAL | 3 refills | Status: DC

## 2023-01-03 NOTE — Telephone Encounter (Signed)
New script sent to the pharmacy  Lab orders mailed to the pt

## 2023-01-03 NOTE — Telephone Encounter (Signed)
-----   Message from Minus Breeding, MD sent at 12/31/2022  5:47 PM EST ----- Results of the CT discussed with patient.  There were no high risk lesions.  There was obtuse marginal and distal RCA.  However, the patient exercised for 10 minutes on a treadmill and had no chest pain.  At this point there is no indication for Intervention.  We are going to pursue continued aggressive risk reduction.  His LDL was 62 and I would like him to be on 80 mg of Lipitor daily instead of 40.  He and I discussed this.  Please call in a prescription for 80 mg Lipitor once daily #90 dispense with 3 refills.  Have him come back in 3 months for fasting lipid profile and LP(a).  Call Mr. Capati with the results and send results to Antony Contras, MD

## 2023-01-04 NOTE — Progress Notes (Signed)
Cardiology Office Note   Date:  01/06/2023   ID:  James Young, DOB 1956/05/14, MRN [id number]  PCP:  Tally Joe, MD  Cardiologist:   Rollene Rotunda, MD Referring:  Tally Joe, MD   Chief Complaint  Patient presents with   Coronary Artery Disease      History of Present Illness: James Young is a 67 y.o. male who is referred by Tally Joe, MD for evaluation of palpitations.  He had a cardiac score that was 773 and 87th percentile. He had a negative adequate POET (Plain Old Exercise Treadmill).  He had some SVT on the monitor.  This year again I screen him with stress test.  He was able to exercise for over 10 minutes and stopped because of leg fatigue no chest pain or shortness of breath.  He did have some ST depression in the inferolateral leads.  I sent him for coronary CTA.  The diseases as described below.  Per FFR there were no high risk findings.  He had distal vessel circumflex disease which may explain the ischemic changes.  He had obtuse marginal plaque.  I brought him back to discuss this.  He exercises routinely and vigorously on the treadmill.  He gets no chest pain.  He has no new shortness of breath, PND or orthopnea.  He has no new palpitations, presyncope or syncope.    Past Medical History:  Diagnosis Date   BPV (benign positional vertigo)    Hyperlipidemia    Hypertension     Past Surgical History:  Procedure Laterality Date   CHOLECYSTECTOMY     CYSTECTOMY     back     Current Outpatient Medications  Medication Sig Dispense Refill   aspirin EC 81 MG tablet Take 1 tablet (81 mg total) by mouth daily. Swallow whole. 90 tablet 3   atorvastatin (LIPITOR) 80 MG tablet Take 1 tablet (80 mg total) by mouth daily. 90 tablet 3   lisinopril (PRINIVIL,ZESTRIL) 10 MG tablet Take 10 mg by mouth daily.   0   Omega-3 Fatty Acids (FISH OIL PO) Take 1 tablet by mouth daily.     No current facility-administered medications for this visit.     Allergies:   Patient has no known allergies.    ROS:  Please see the history of present illness.   Otherwise, review of systems are positive for none.   All other systems are reviewed and negative.    PHYSICAL EXAM: VS:  BP 131/87   Pulse (!) 59   Ht 5\' 11"  (1.803 m)   Wt 220 lb (99.8 kg)   SpO2 96%   BMI 30.68 kg/m  , BMI Body mass index is 30.68 kg/m. GENERAL:  Well appearing NECK:  No jugular venous distention, waveform within normal limits, carotid upstroke brisk and symmetric, no bruits, no thyromegaly LUNGS:  Clear to auscultation bilaterally CHEST:  Unremarkable HEART:  PMI not displaced or sustained,S1 and S2 within normal limits, no S3, no S4, no clicks, no rubs, no murmurs ABD:  Flat, positive bowel sounds normal in frequency in pitch, no bruits, no rebound, no guarding, no midline pulsatile mass, no hepatomegaly, no splenomegaly EXT:  2 plus pulses throughout, no edema, no cyanosis no clubbing   CTA  12/31/2021  1. Severe CAD in the proximal first OM and distal RCA, 70-99% stenosis, CADRADS 4. CT FFR will be performed and reported separately. 2. Moderate-severe CAD in proximal LAD, 50-69% stenosis, CADRADS 3. 3. Coronary calcium  score is 905, which places the patient in the 87th percentile for age and sex matched control. 4. Normal coronary origins with right dominance. RECOMMENDATIONS: CAD-RADS 3. Moderate stenosis. Consider symptom-guided anti-ischemic pharmacotherapy as well as risk factor modification per guideline directed care. Additional analysis with CT FFR will be submitted.    FINDINGS: 1. Left Main: Low likelihood of hemodynamic significance.   2. LAD: Low likelihood of hemodynamic significance. FFR of proximal lesion 0.89. 3. LCX: Low likelihood of hemodynamic significance.  FFR OM1 0.83. 4. RCA: Borderline likelihood of hemodynamic significance, FFR distal RCA 0.78   IMPRESSION: 1. CT FFR analysis demonstrates borderline likelihood of  hemodynamic significance in distal RCA, FFR 0.78. Vessel inclusive of calcium appears >2 mm in this location.  EKG:  EKG is  ordered today. Sinus rhythm, rate 59, axis within normal limits, intervals within normal limits, no acute ST-T wave changes.  Recent Labs: No results found for requested labs within last 365 days.    Lipid Panel    Component Value Date/Time   CHOL 129 03/09/2022 0917   TRIG 200 (H) 03/09/2022 0917   HDL 34 (L) 03/09/2022 0917   CHOLHDL 3.8 03/09/2022 0917   LDLCALC 62 03/09/2022 0917      Wt Readings from Last 3 Encounters:  01/06/23 220 lb (99.8 kg)  04/01/22 221 lb 3.2 oz (100.3 kg)  10/13/21 219 lb (99.3 kg)      Other studies Reviewed: Additional studies/ records that were reviewed today include: CT Review of the above records demonstrates:  Please see elsewhere in the note.     ASSESSMENT AND PLAN:  PALPITATIONS:    He has had some SVT.  He is not particular bothersome.  No change in therapy.   HTN: Blood pressures at target.  No change in therapy.  DYSLIPIDEMIA: He is LDL is mildly elevated and not at the 50 target but I wanted so I increase his Lipitor recently and we will check an LP(a) and lipid profile in another 3 months.  E  ELEVATED CORONARY CALCIUM/CAD:    We had a long discussion.  His wife is in the room today.  I went through the anatomy in detail.  He has distal vessel branch vessel disease.  FFR does not suggest high-grade proximal large vessel obstructive disease.  He has an excellent exercise tolerance.  He has no unstable symptoms.  We are going to pursue aggressive risk reduction.   Current medicines are reviewed at length with the patient today.  The patient does not have concerns regarding medicines.  The following changes have been made:  None  Labs/ tests ordered today include: None  Orders Placed This Encounter  Procedures   Lipid panel   Lipoprotein A (LPA)   EKG 12-Lead    Disposition:   FU with me six  months.   Signed, Rollene Rotunda, MD  01/06/2023 10:27 AM    [facility]

## 2023-01-06 ENCOUNTER — Encounter: Payer: Self-pay | Admitting: Cardiology

## 2023-01-06 ENCOUNTER — Ambulatory Visit: Payer: Medicare Other | Attending: Cardiology | Admitting: Cardiology

## 2023-01-06 VITALS — BP 131/87 | HR 59 | Ht 71.0 in | Wt 220.0 lb

## 2023-01-06 DIAGNOSIS — R002 Palpitations: Secondary | ICD-10-CM | POA: Diagnosis not present

## 2023-01-06 DIAGNOSIS — I10 Essential (primary) hypertension: Secondary | ICD-10-CM | POA: Diagnosis not present

## 2023-01-06 DIAGNOSIS — R931 Abnormal findings on diagnostic imaging of heart and coronary circulation: Secondary | ICD-10-CM | POA: Diagnosis not present

## 2023-01-06 DIAGNOSIS — E785 Hyperlipidemia, unspecified: Secondary | ICD-10-CM

## 2023-01-06 MED ORDER — ASPIRIN 81 MG PO TBEC: 81.0000 mg | DELAYED_RELEASE_TABLET | Freq: Every day | ORAL | 3 refills | Status: AC

## 2023-01-06 NOTE — Patient Instructions (Signed)
Medication Instructions:  START aspirin 81 mg daily  *If you need a refill on your cardiac medications before your next appointment, please call your pharmacy*   Lab Work: Please return for FASTING labs in 3 months (Lipid, LPa)  Our in office lab hours are Monday-Friday 8:00-4:00, closed for lunch 12:45-1:45 pm.  No appointment needed.  [facility] locations:   [place] ([place]) - [id number] [place] [address] [place] [address] [place] - [address] ([facility])   Follow-Up: At [facility], you and your health needs are our priority.  As part of our continuing mission to provide you with exceptional heart care, we have created designated Provider Care Teams.  These Care Teams include your primary Cardiologist (physician) and Advanced Practice Providers (APPs -  Physician Assistants and Nurse Practitioners) who all work together to provide you with the care you need, when you need it.  We recommend signing up for the patient portal called "MyChart".  Sign up information is provided on this After Visit Summary.  MyChart is used to connect with patients for Virtual Visits (Telemedicine).  Patients are able to view lab/test results, encounter notes, upcoming appointments, etc.  Non-urgent messages can be sent to your provider as well.   To learn more about what you can do with MyChart, go to [email or web address].    Your next appointment:   6 month(s)  Provider:   Minus Breeding, MD

## 2023-01-20 DIAGNOSIS — X32XXXD Exposure to sunlight, subsequent encounter: Secondary | ICD-10-CM | POA: Diagnosis not present

## 2023-01-20 DIAGNOSIS — D225 Melanocytic nevi of trunk: Secondary | ICD-10-CM | POA: Diagnosis not present

## 2023-01-20 DIAGNOSIS — L570 Actinic keratosis: Secondary | ICD-10-CM | POA: Diagnosis not present

## 2023-01-20 DIAGNOSIS — Z1283 Encounter for screening for malignant neoplasm of skin: Secondary | ICD-10-CM | POA: Diagnosis not present

## 2023-01-20 DIAGNOSIS — Z08 Encounter for follow-up examination after completed treatment for malignant neoplasm: Secondary | ICD-10-CM | POA: Diagnosis not present

## 2023-01-20 DIAGNOSIS — B078 Other viral warts: Secondary | ICD-10-CM | POA: Diagnosis not present

## 2023-01-20 DIAGNOSIS — Z85820 Personal history of malignant melanoma of skin: Secondary | ICD-10-CM | POA: Diagnosis not present

## 2023-03-24 DIAGNOSIS — H2513 Age-related nuclear cataract, bilateral: Secondary | ICD-10-CM | POA: Diagnosis not present

## 2023-05-16 DIAGNOSIS — E785 Hyperlipidemia, unspecified: Secondary | ICD-10-CM | POA: Diagnosis not present

## 2023-05-16 DIAGNOSIS — R931 Abnormal findings on diagnostic imaging of heart and coronary circulation: Secondary | ICD-10-CM | POA: Diagnosis not present

## 2023-05-17 LAB — LIPID PANEL
Cholesterol, Total: 117 mg/dL (ref 100–199)
LDL Chol Calc (NIH): 59 mg/dL (ref 0–99)
Triglycerides: 128 mg/dL (ref 0–149)

## 2023-05-18 LAB — LIPID PANEL
Chol/HDL Ratio: 3.3 ratio (ref 0.0–5.0)
HDL: 35 mg/dL — ABNORMAL LOW (ref >39)
VLDL Cholesterol Cal: 23 mg/dL (ref 5–40)

## 2023-05-18 LAB — LIPOPROTEIN A (LPA): Lipoprotein (a): 194.9 nmol/L — ABNORMAL HIGH (ref <75.0)

## 2023-05-20 ENCOUNTER — Encounter: Payer: Self-pay | Admitting: *Deleted

## 2023-06-27 DIAGNOSIS — L821 Other seborrheic keratosis: Secondary | ICD-10-CM | POA: Diagnosis not present

## 2023-07-07 ENCOUNTER — Ambulatory Visit: Payer: Medicare Other | Admitting: Cardiology

## 2023-07-13 ENCOUNTER — Encounter: Payer: Self-pay | Admitting: Cardiology

## 2023-07-13 ENCOUNTER — Ambulatory Visit: Payer: Medicare Other | Attending: Cardiology | Admitting: Cardiology

## 2023-07-13 VITALS — BP 130/74 | HR 55 | Ht 72.0 in | Wt 218.0 lb

## 2023-07-13 DIAGNOSIS — I2510 Atherosclerotic heart disease of native coronary artery without angina pectoris: Secondary | ICD-10-CM

## 2023-07-13 DIAGNOSIS — E785 Hyperlipidemia, unspecified: Secondary | ICD-10-CM | POA: Diagnosis not present

## 2023-07-13 DIAGNOSIS — R002 Palpitations: Secondary | ICD-10-CM

## 2023-07-13 NOTE — Progress Notes (Signed)
  Cardiology Office Note:   Date:  07/13/2023  ID:  James Young, DOB [birth year], MRN [id number] PCP: James Joe, MD  [facility] Providers Cardiologist:  James Rotunda, MD {  History of Present Illness:   James Young is a 67 y.o. male who is referred by James Joe, MD for evaluation of palpitations.  He had a cardiac score that was 773 and 87th percentile. He had a negative adequate POET (Plain Old Exercise Treadmill).  He had some SVT on the monitor.  This year again I screen him with stress test.  He was able to exercise for over 10 minutes and stopped because of leg fatigue no chest pain or shortness of breath.  He did have some ST depression in the inferolateral leads.   I sent him for coronary CTA.  He had non obstructive disease. Per FFR there were no high risk findings.  He had distal vessel circumflex disease which may explain the ischemic changes.  He had obtuse marginal plaque.    The patient denies any new symptoms such as chest discomfort, neck or arm discomfort. There has been no new shortness of breath, PND or orthopnea. There have been no reported palpitations, presyncope or syncope. He does the treadmill 5 days/week.  ROS: As stated in the HPI and negative for all other systems.  Studies Reviewed:    EKG:   NA  Risk Assessment/Calculations:              Physical Exam:   VS:  BP 130/74 (BP Location: Left Arm, Patient Position: Sitting, Cuff Size: Normal)   Pulse (!) 55   Ht 6' (1.829 m)   Wt 218 lb (98.9 kg)   SpO2 95%   BMI 29.57 kg/m    Wt Readings from Last 3 Encounters:  07/13/23 218 lb (98.9 kg)  01/06/23 220 lb (99.8 kg)  04/01/22 221 lb 3.2 oz (100.3 kg)     GEN: Well nourished, well developed in no acute distress NECK: No JVD; No carotid bruits CARDIAC: RRR, no murmurs, rubs, gallops RESPIRATORY:  Clear to auscultation without rales, wheezing or rhonchi  ABDOMEN: Soft, non-tender, non-distended EXTREMITIES:  No edema; No deformity    ASSESSMENT AND PLAN:   PALPITATIONS: He has rare palpitations since stopping caffeine.  No change in therapy.   HTN: Blood pressures is at target.  No change in therapy.    DYSLIPIDEMIA: His LDL is 59 with an HDL of 35.  Continue the meds as listed.  I told him I thought the fish oil was optional.   ELEVATED CORONARY CALCIUM/CAD:    He has no symptoms related to this and he will participate with continued primary risk reduction.         Follow up with me in one year.   Signed, James Rotunda, MD

## 2023-07-13 NOTE — Patient Instructions (Signed)
Medication Instructions:  Your physician recommends that you continue on your current medications as directed. Please refer to the Current Medication list given to you today.   *If you need a refill on your cardiac medications before your next appointment, please call your pharmacy*     Follow-Up: At [facility], you and your health needs are our priority.  As part of our continuing mission to provide you with exceptional heart care, we have created designated Provider Care Teams.  These Care Teams include your primary Cardiologist (physician) and Advanced Practice Providers (APPs -  Physician Assistants and Nurse Practitioners) who all work together to provide you with the care you need, when you need it.  We recommend signing up for the patient portal called "MyChart".  Sign up information is provided on this After Visit Summary.  MyChart is used to connect with patients for Virtual Visits (Telemedicine).  Patients are able to view lab/test results, encounter notes, upcoming appointments, etc.  Non-urgent messages can be sent to your provider as well.   To learn more about what you can do with MyChart, go to [email or web address].    Your next appointment:   1 year(s)  The format for your next appointment:   In Person  Provider:   Rollene Rotunda, MD

## 2023-09-01 ENCOUNTER — Ambulatory Visit: Payer: Medicare Other | Admitting: Cardiology

## 2023-12-15 DIAGNOSIS — I10 Essential (primary) hypertension: Secondary | ICD-10-CM | POA: Diagnosis not present

## 2023-12-15 DIAGNOSIS — E782 Mixed hyperlipidemia: Secondary | ICD-10-CM | POA: Diagnosis not present

## 2023-12-15 DIAGNOSIS — Z125 Encounter for screening for malignant neoplasm of prostate: Secondary | ICD-10-CM | POA: Diagnosis not present

## 2023-12-15 DIAGNOSIS — Z1331 Encounter for screening for depression: Secondary | ICD-10-CM | POA: Diagnosis not present

## 2023-12-15 DIAGNOSIS — Z0000 Encounter for general adult medical examination without abnormal findings: Secondary | ICD-10-CM | POA: Diagnosis not present

## 2023-12-21 ENCOUNTER — Other Ambulatory Visit: Payer: Self-pay | Admitting: Cardiology

## 2023-12-21 DIAGNOSIS — E785 Hyperlipidemia, unspecified: Secondary | ICD-10-CM

## 2023-12-21 DIAGNOSIS — R931 Abnormal findings on diagnostic imaging of heart and coronary circulation: Secondary | ICD-10-CM

## 2023-12-24 IMAGING — CT CT CARDIAC CORONARY ARTERY CALCIUM SCORE
3 series · 14 of 20 positions shown, 16 images · non-contrast
Comparison: None.
COMPARISON: None.

Addendum:
EXAM:
OVER-READ INTERPRETATION  CT CHEST

The following report is an over-read performed by radiologist Dr.
Labelle Salha Brack Tiger [REDACTED] on 12/03/2021. This
over-read does not include interpretation of cardiac or coronary
anatomy or pathology. The coronary calcium score interpretation by
the cardiologist is attached.
CLINICAL DATA: Cardiovascular Disease Risk stratification
Coronary Calcium Score
TECHNIQUE: A gated, non-contrast computed tomography scan of the heart was
performed using 3mm slice thickness. Axial images were analyzed on a
dedicated workstation. Calcium scoring of the coronary arteries was
performed using the Agatston method.

[Series 2: cascseq 2.0 sa36 70% (id) · axial · 0.42mm/px · z∈[+1193,+1283]mm · 4 of 76 slices shown]
[im 16/76  vessel]
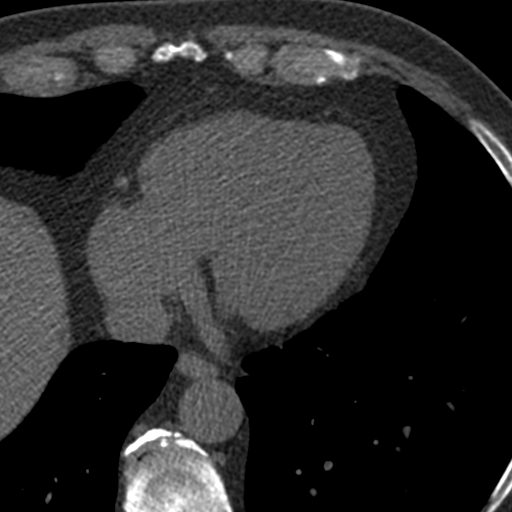
[im 31/76  vessel]
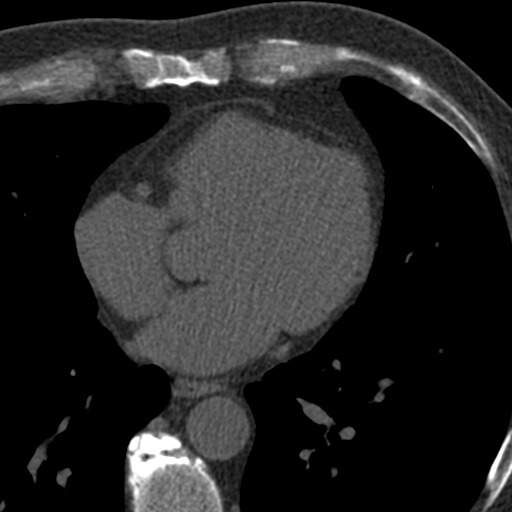
[im 46/76  vessel]
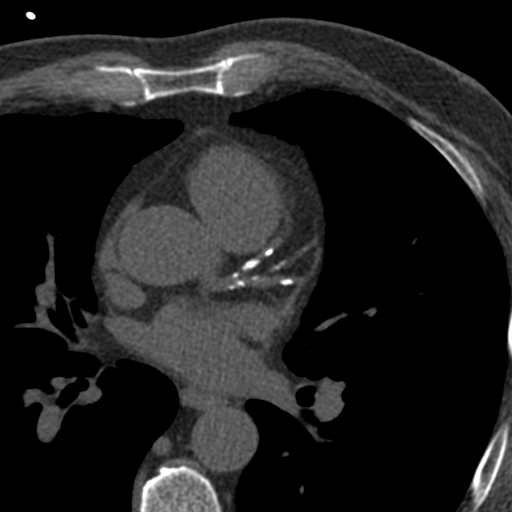
[im 61/76  vessel]
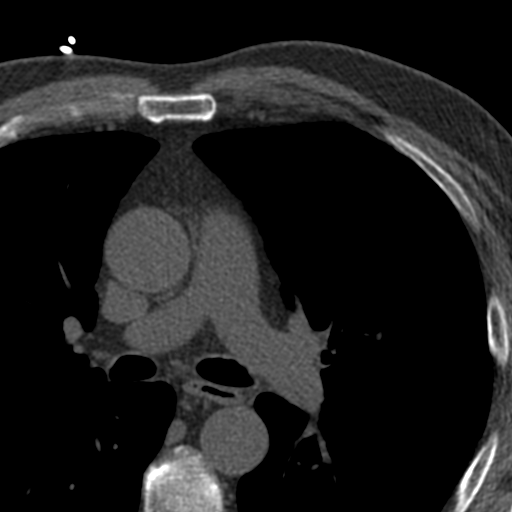

[Series 3: cascseq 2.0 bf37 st · axial · 0.84mm/px · z∈[+1187,+1287]mm · 5 of 76 slices shown, 7 images]
[im 13/76  vessel]
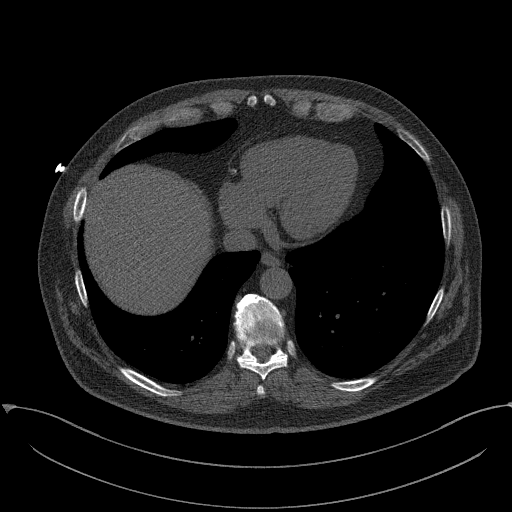
[im 13/76  lung]
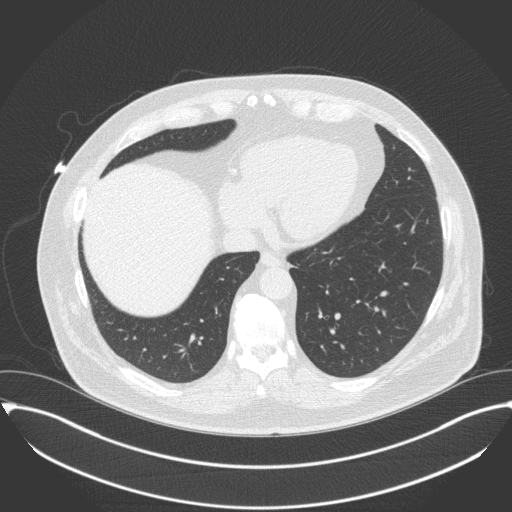
[im 26/76  vessel]
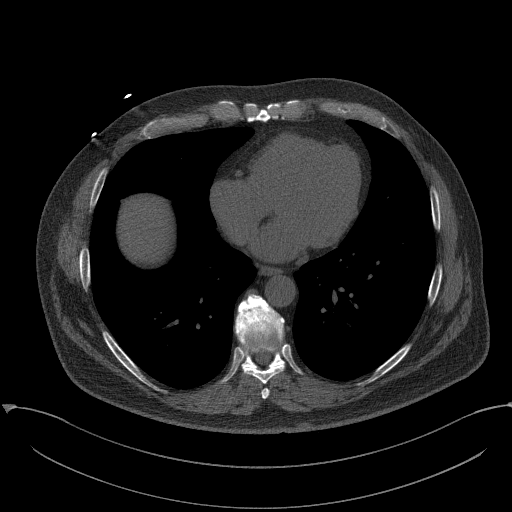
[im 38/76  vessel]
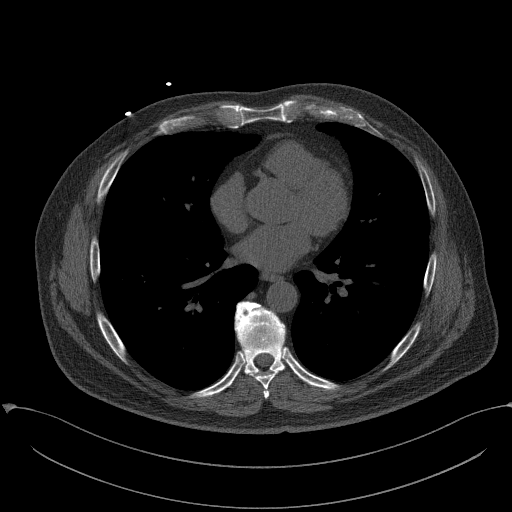
[im 51/76  vessel]
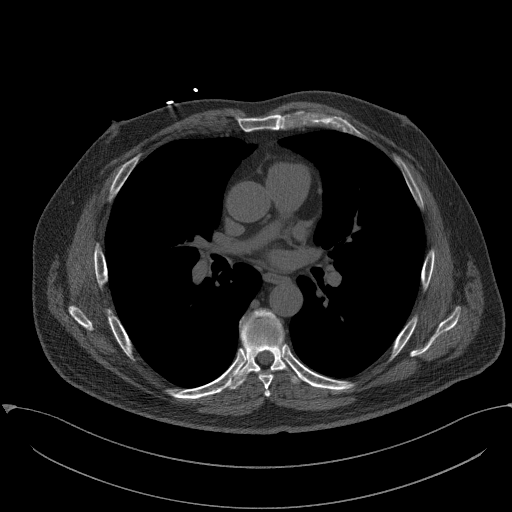
[im 63/76  vessel]
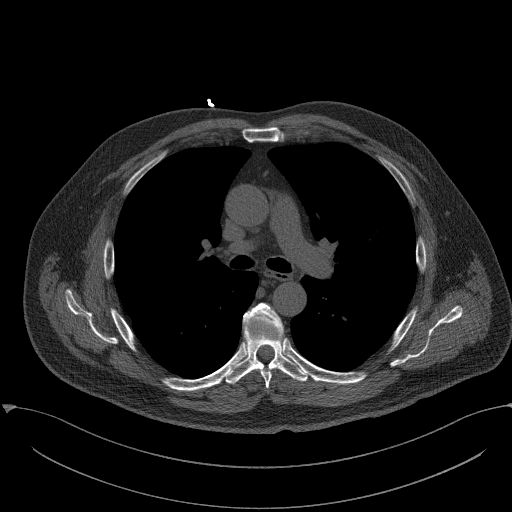
[im 63/76  lung]
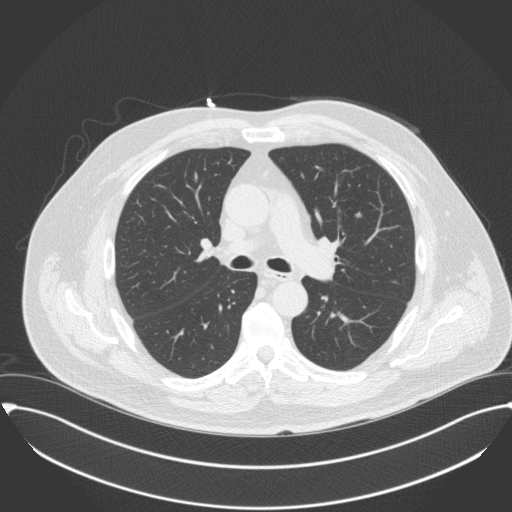

[Series 4: cascseq 2.0 br59 lung · axial · 0.84mm/px · z∈[+1187,+1287]mm · 5 of 76 slices shown]
[im 13/76  lung]
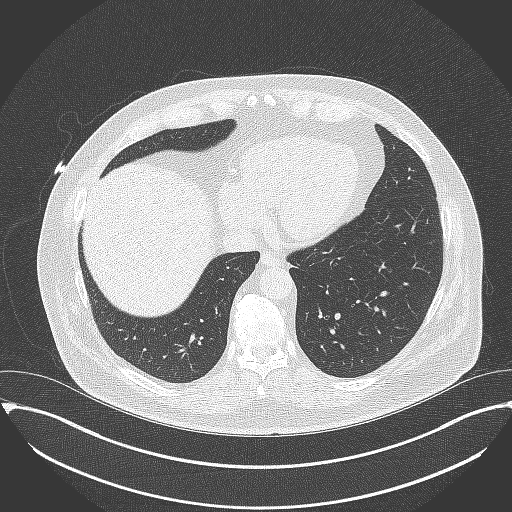
[im 26/76  lung]
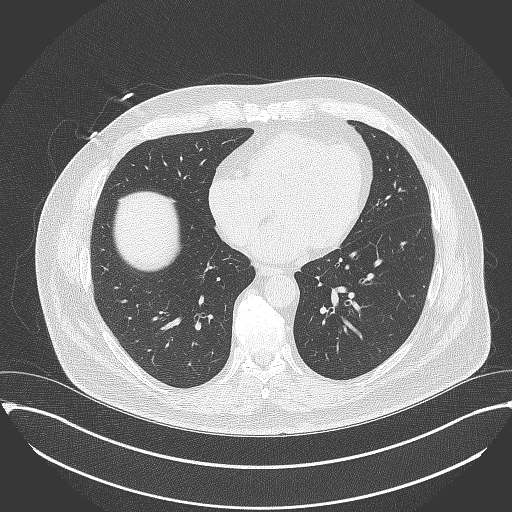
[im 38/76  lung]
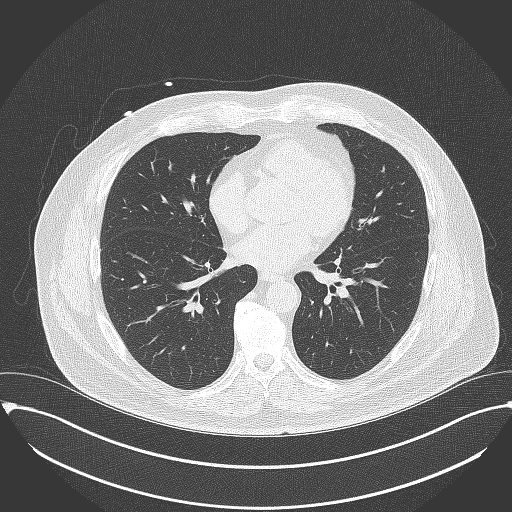
[im 51/76  lung]
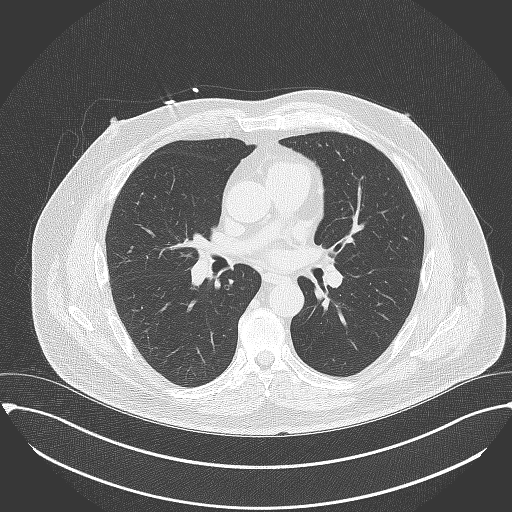
[im 63/76  lung]
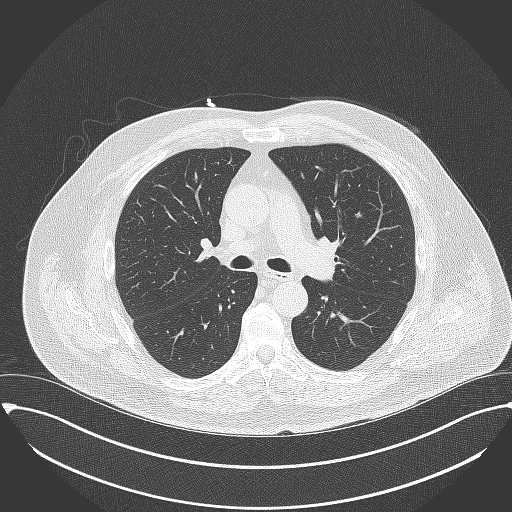

[14 of 20 positions shown; findings below may reference images not displayed]

FINDINGS: Small calcified granulomas throughout the lungs bilaterally. Within
the visualized portions of the thorax there are no suspicious
appearing pulmonary nodules or masses, there is no acute
consolidative airspace disease, no pleural effusions, no
pneumothorax and no lymphadenopathy. Visualized portions of the
upper abdomen are unremarkable. There are no aggressive appearing
lytic or blastic lesions noted in the visualized portions of the
skeleton.
IMPRESSION: 1. Old granulomatous disease, as above.
FINDINGS: Coronary arteries: Normal origins.

Coronary Calcium Score:

Left main: 0

Left anterior descending artery: 438

Left circumflex artery: 25

Right coronary artery: 310

Total: 773

Percentile: 87

Pericardium: Normal.

Aorta: Normal caliber of ascending aorta. No aortic atherosclerosis
noted.

Non-cardiac: See separate report from [REDACTED].
IMPRESSION: Coronary calcium score of 773. This was 87th percentile for age-,
race-, and sex-matched controls.



If CAC=0, it is reasonable to withhold statin therapy and reassess
in 5 to 10 years, as long as higher risk conditions are absent
(diabetes mellitus, family history of premature CHD in first degree
relatives (males <55 years; females <65 years), cigarette smoking,
or LDL >=190 mg/dL).

If CAC is 1 to 99, it is reasonable to initiate statin therapy for
patients >=55 years of age.

If CAC is >=100 or >=75th percentile, it is reasonable to initiate
statin therapy at any age.

Cardiology referral should be considered for patients with CAC
scores >=400 or >=75th percentile.

*6100 AHA/ACC/AACVPR/AAPA/ABC/SUNGKYUN/LUBAGO/MANZANO/Dallos/DORVIL/OBREGON/GIORGI
Guideline on the Management of Blood Cholesterol: A Report of the
American College of Cardiology/American Heart Association Task Force
on Clinical Practice Guidelines. J Am Coll Cardiol.
2017;73(24):4878-4557.

*** End of Addendum ***
EXAM:
OVER-READ INTERPRETATION  CT CHEST

The following report is an over-read performed by radiologist Dr.
Labelle Salha Brack Tiger [REDACTED] on 12/03/2021. This
over-read does not include interpretation of cardiac or coronary
anatomy or pathology. The coronary calcium score interpretation by
the cardiologist is attached.
FINDINGS: Small calcified granulomas throughout the lungs bilaterally. Within
the visualized portions of the thorax there are no suspicious
appearing pulmonary nodules or masses, there is no acute
consolidative airspace disease, no pleural effusions, no
pneumothorax and no lymphadenopathy. Visualized portions of the
upper abdomen are unremarkable. There are no aggressive appearing
lytic or blastic lesions noted in the visualized portions of the
skeleton.
IMPRESSION: 1. Old granulomatous disease, as above.

## 2024-01-14 IMAGING — US US ABDOMINAL AORTA SCREENING AAA
1 series · 7 of 7 positions shown · non-contrast
Comparison: None.

CLINICAL DATA: 65-year-old male with significant smoking history.

EXAM:
US ABDOMINAL AORTA MEDICARE SCREENING
TECHNIQUE: Ultrasound examination of the abdominal aorta was performed as a
screening evaluation for abdominal aortic aneurysm.

[Series 1: us abdominal aorta screening aaa · 0.33mm/px · 7 of 7 slices shown]
[im 1/7]
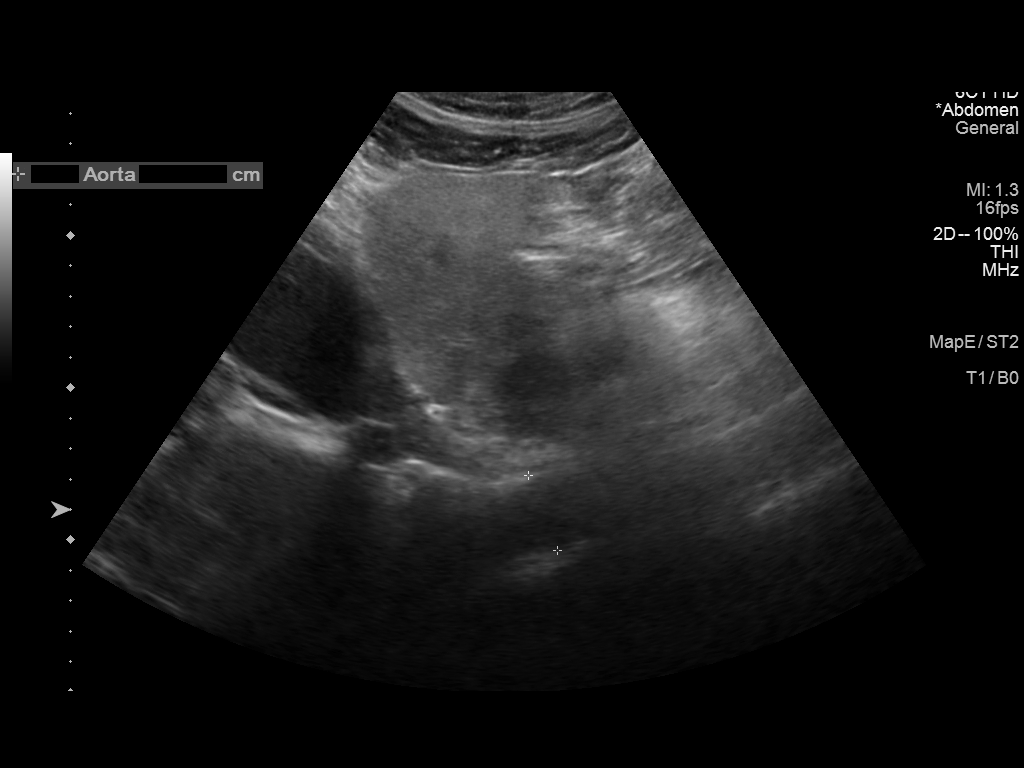
[im 2/7]
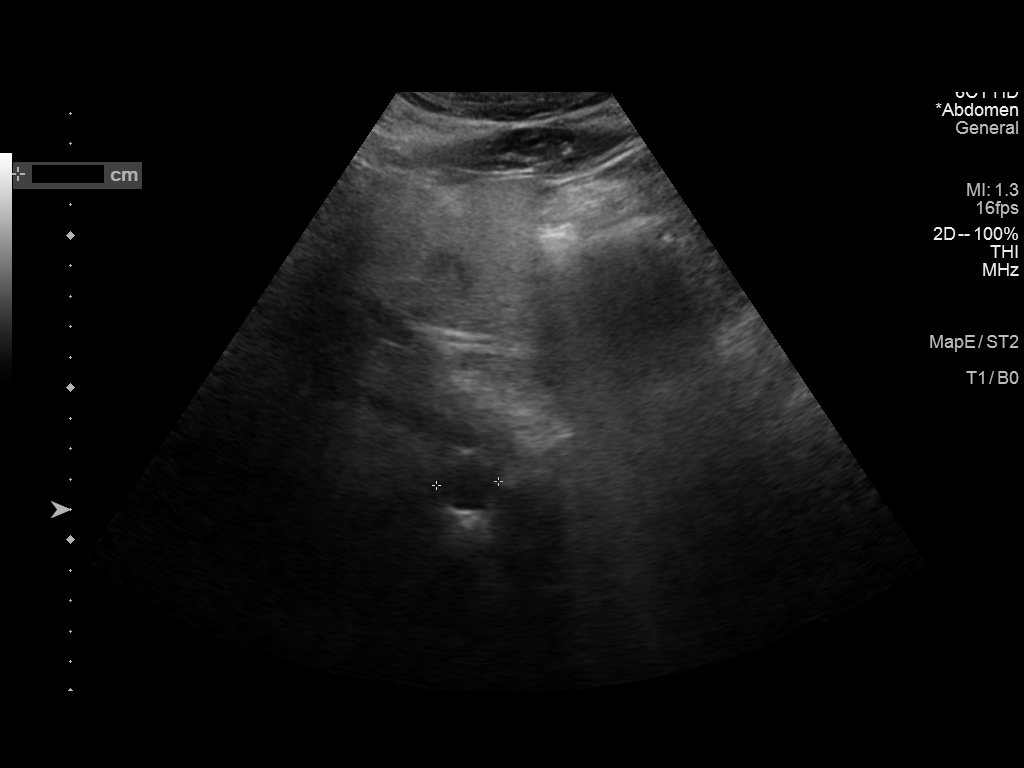
[im 3/7]
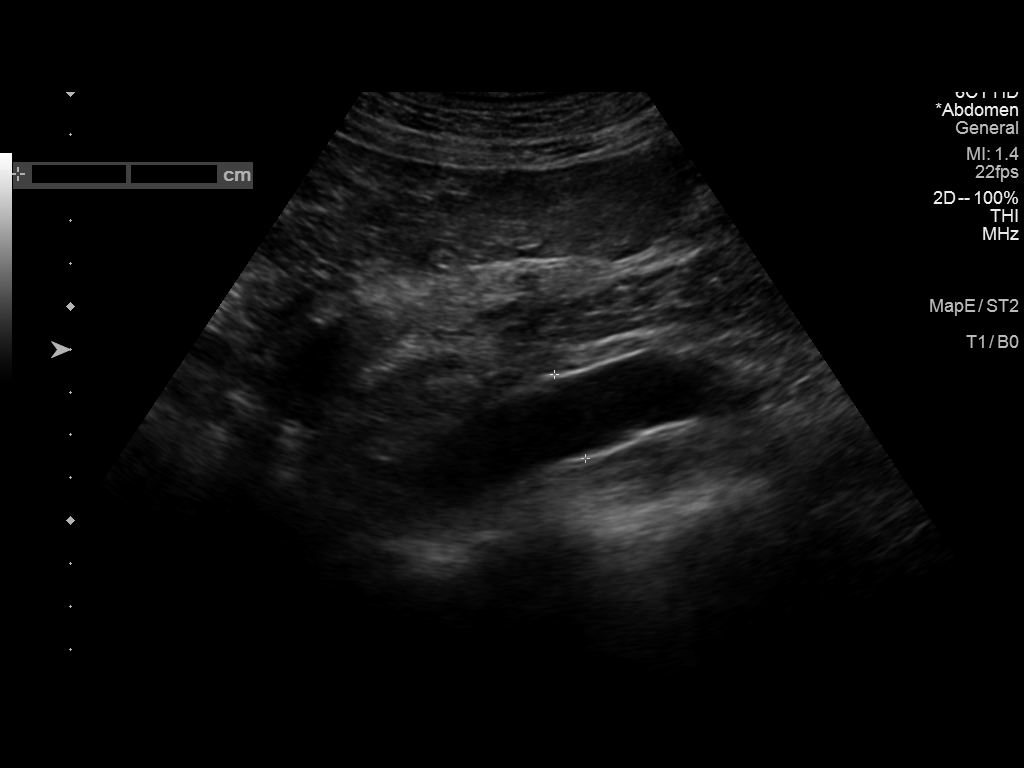
[im 4/7]
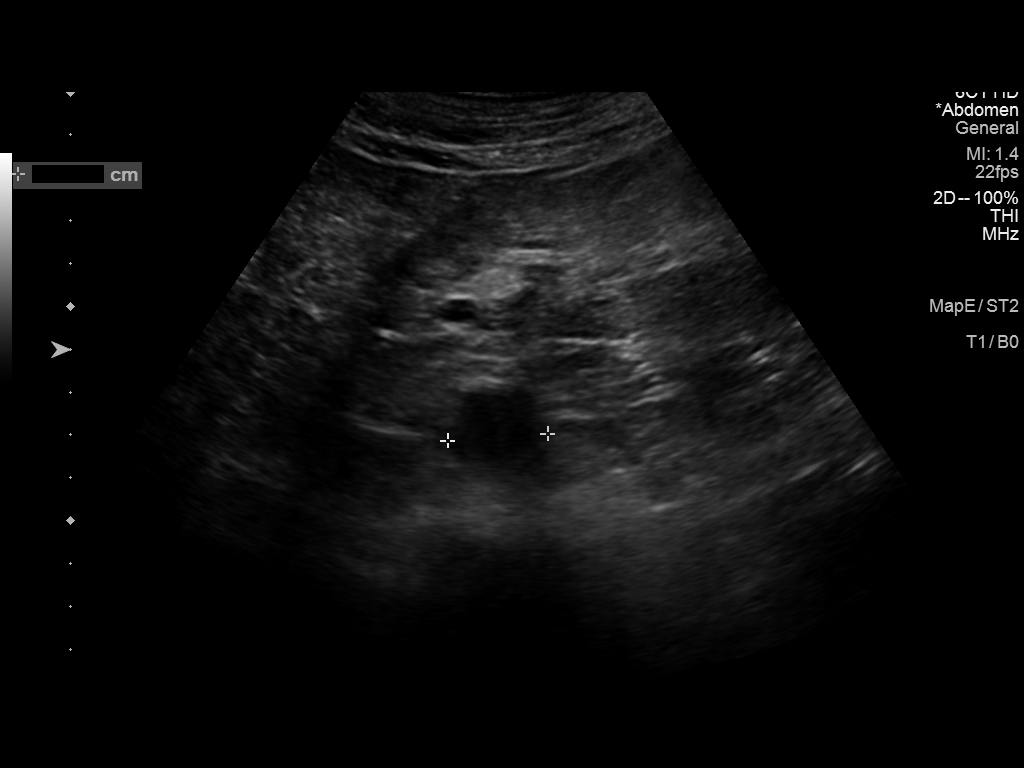
[im 5/7]
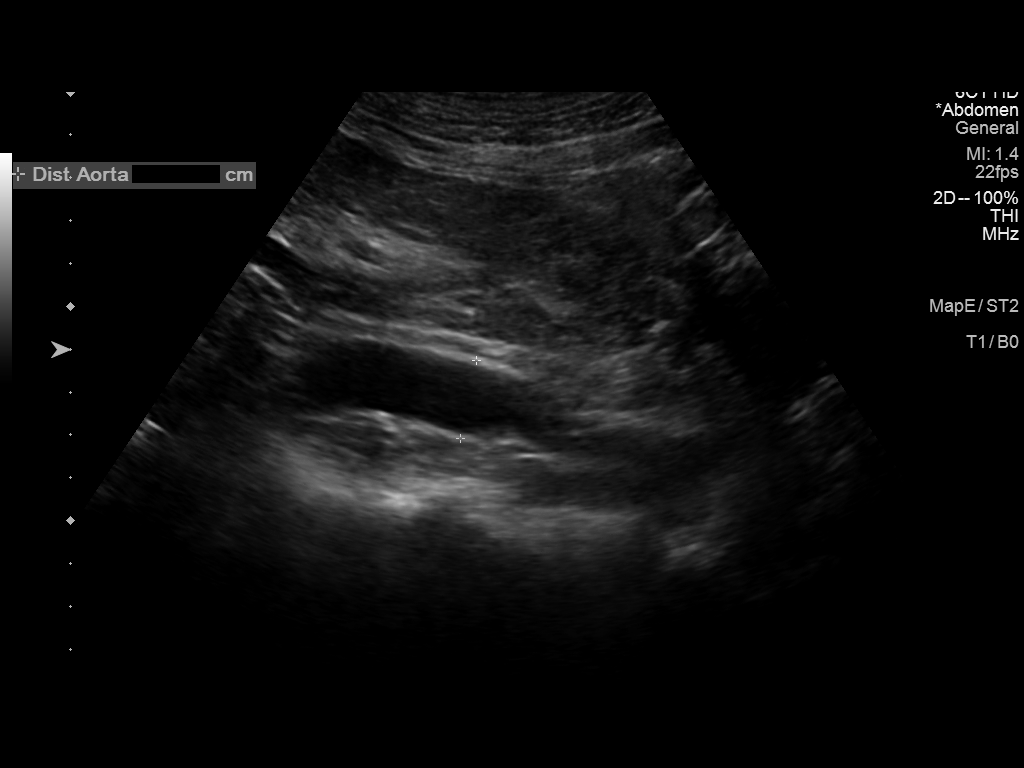
[im 6/7]
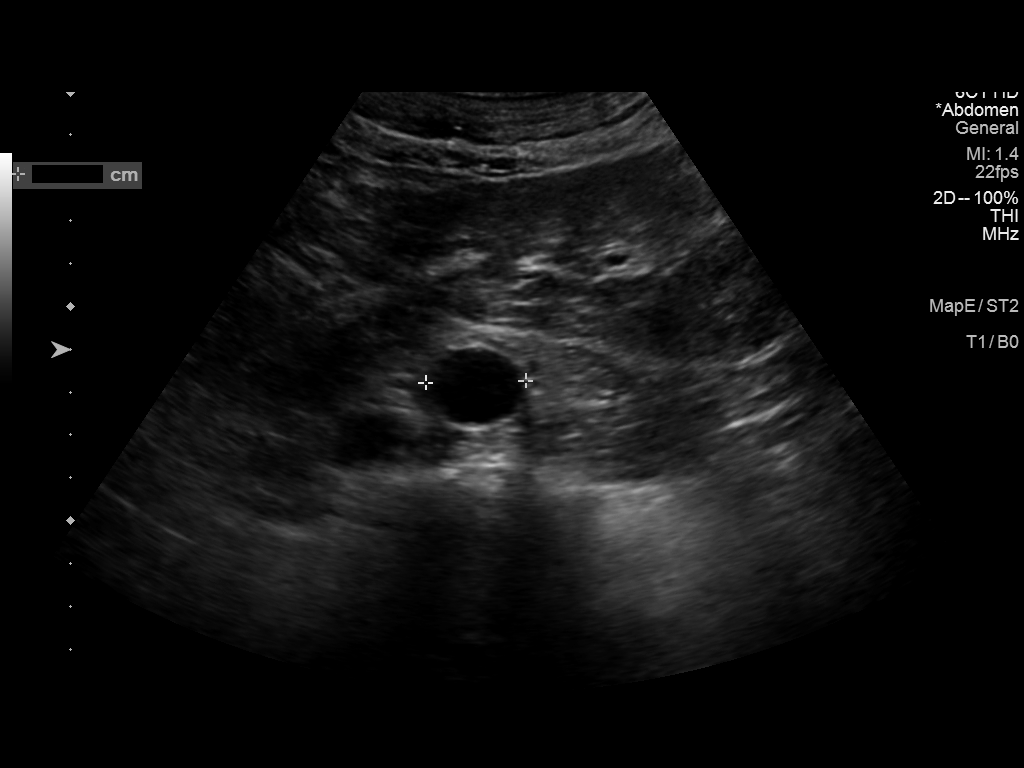
[im 7/7]
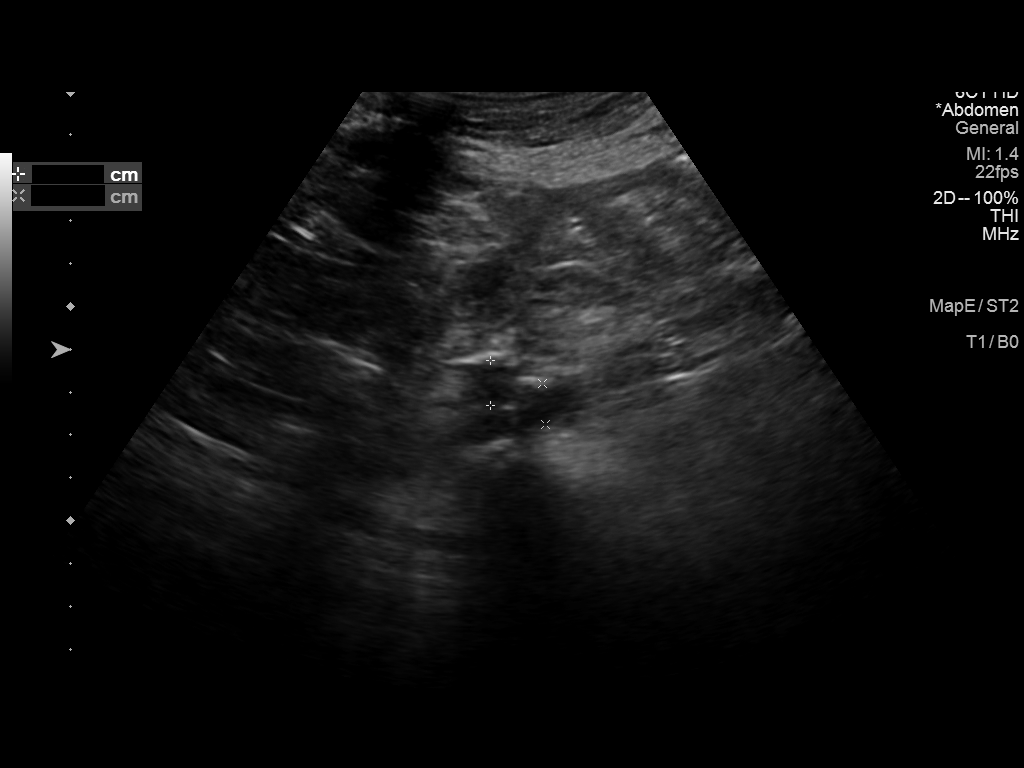

[7 of 7 positions shown; findings below may reference images not displayed]

FINDINGS: Abdominal aortic measurements as follows:

Proximal:  2.6 x 2.0 cm

Mid:  2.1 x 2.3 cm

Distal:  1.9 x 2.3 cm
IMPRESSION: No evidence of abdominal aortic aneurysm.

## 2024-02-16 DIAGNOSIS — X32XXXD Exposure to sunlight, subsequent encounter: Secondary | ICD-10-CM | POA: Diagnosis not present

## 2024-02-16 DIAGNOSIS — L570 Actinic keratosis: Secondary | ICD-10-CM | POA: Diagnosis not present

## 2024-02-16 DIAGNOSIS — Z1283 Encounter for screening for malignant neoplasm of skin: Secondary | ICD-10-CM | POA: Diagnosis not present

## 2024-02-16 DIAGNOSIS — Z08 Encounter for follow-up examination after completed treatment for malignant neoplasm: Secondary | ICD-10-CM | POA: Diagnosis not present

## 2024-02-16 DIAGNOSIS — Z85828 Personal history of other malignant neoplasm of skin: Secondary | ICD-10-CM | POA: Diagnosis not present

## 2024-03-26 DIAGNOSIS — H5213 Myopia, bilateral: Secondary | ICD-10-CM | POA: Diagnosis not present

## 2024-06-06 DIAGNOSIS — M25512 Pain in left shoulder: Secondary | ICD-10-CM | POA: Diagnosis not present

## 2024-07-19 DIAGNOSIS — M25512 Pain in left shoulder: Secondary | ICD-10-CM | POA: Diagnosis not present

## 2024-09-07 DIAGNOSIS — L570 Actinic keratosis: Secondary | ICD-10-CM | POA: Diagnosis not present

## 2024-09-07 DIAGNOSIS — X32XXXD Exposure to sunlight, subsequent encounter: Secondary | ICD-10-CM | POA: Diagnosis not present

## 2024-11-14 ENCOUNTER — Other Ambulatory Visit: Payer: Self-pay | Admitting: Medical Genetics

## 2024-11-15 ENCOUNTER — Other Ambulatory Visit (HOSPITAL_COMMUNITY)
Admission: RE | Admit: 2024-11-15 | Discharge: 2024-11-15 | Disposition: A | Discharge status: Home / Self Care | Payer: Self-pay | Source: Ambulatory Visit | Attending: Medical Genetics | Admitting: Medical Genetics

## 2024-11-22 ENCOUNTER — Ambulatory Visit: Admitting: Podiatry

## 2024-11-22 ENCOUNTER — Ambulatory Visit

## 2024-11-22 ENCOUNTER — Encounter: Payer: Self-pay | Admitting: Podiatry

## 2024-11-22 DIAGNOSIS — M216X2 Other acquired deformities of left foot: Secondary | ICD-10-CM

## 2024-11-22 DIAGNOSIS — M722 Plantar fascial fibromatosis: Secondary | ICD-10-CM

## 2024-11-22 DIAGNOSIS — M7752 Other enthesopathy of left foot: Secondary | ICD-10-CM

## 2024-11-22 MED ORDER — BETAMETHASONE SOD PHOS & ACET 6 (3-3) MG/ML IJ SUSP
6.0000 mg | Freq: Once | INTRAMUSCULAR | Status: AC
  Administered 2024-11-22: 6 mg

## 2024-11-22 MED ORDER — MELOXICAM 15 MG PO TABS: 15.0000 mg | ORAL_TABLET | Freq: Every day | ORAL | 0 refills | Status: AC

## 2024-11-22 NOTE — Patient Instructions (Signed)

## 2024-11-22 NOTE — Progress Notes (Signed)
 "  Subjective:  Patient ID: James Young, male    DOB: July 20, 1956,  MRN: [id number]  Chief Complaint  Patient presents with   Foot Pain    L heel pain plantar extending into arch now and them x 6- 8 weeks.      Discussed the use of AI scribe software for clinical note transcription with the patient, who gave verbal consent to proceed.  History of Present Illness James Young is a 69 year old male who presents with persistent left heel pain following treadmill use.  Left heel pain is localized mainly to the medial heel fat pad with occasional radiation into the arch and milder central heel discomfort. Pain is usually 3 to 4 out of 10.  Pain worsens with activity such as treadmill walking and hiking and improves with rest. It is most pronounced with the first few steps in the morning or after rest. He has altered his gait by walking on his toes to avoid heel pressure, which sometimes increases arch discomfort. He has had no similar heel pain in the past.  He is physically active and walks on the treadmill at least one hour daily, up to two hours, and hikes periodically. He is not running or jogging. He has remote intermittent big toe pain and sensitivity that resolved and has no current toe symptoms. He wears extra wide shoes that are comfortable. He has no low back pain.      Objective:    Physical Exam CARDIOVASCULAR: Pulse intact. MUSCULOSKELETAL: Equinus with less than 10 degrees of ankle dorsiflexion, knees extended, high arch foot type. Muscle strength 5/5 in major muscle groups. No pain on heel squeeze or Achilles palpation.  Tenderness on palpation of left foot plantar fascia plantar medial heel and plantar central heel.  No pain with calcaneal squeeze. NEUROLOGICAL: Light touch sensation intact.   No images are attached to the encounter.    Results Radiology Left foot x-ray: No fracture, no significant arthropathy, plantar and posterior calcaneal spurs.  High arch foot  type noted.  (Independently interpreted)     Assessment:   1. Plantar fasciitis of left foot   2. Equinus deformity of left foot      Plan:  Patient was evaluated and treated and all questions answered.  Assessment and Plan Assessment & Plan Plantar fasciitis of the left foot Chronic plantar fasciitis due to high arch foot type and equinus deformity. No fracture or arthritis on x-ray. Gradual improvement expected with anti-inflammatory and rehabilitative measures. - Administered steroid injection to the left plantar fascia. - Prescribed two-week course of oral anti-inflammatory medication, continue as needed.  Meloxicam  15 mg 30-day supply sent to patient's pharmacy once a day, above-stated regimen. - Provided stretching and strengthening exercises for Achilles and plantar fascia, 2-3 times daily. - Recommended frozen water bottle massage and towel/marble exercises. - Advised against barefoot walking; use supportive house shoes when weightbearing. - Fitted with plantar fascia offloading brace; wear when weightbearing, not while resting or sleeping.  This is an ankle gauntlet device with offloading elastic strap to alleviate offload medial band plantar fascia and medial plantar arch.  Documentation on file. - Recommended temporary reduction of treadmill and high-impact activities; daily activities permitted. - Scheduled follow-up in three weeks. - Discussed appropriate shoe brands and provided written recommendations.  Equinus deformity of the left ankle Equinus deformity with less than ten degrees of dorsiflexion, contributing to plantar fasciitis and altered gait mechanics. Improving dorsiflexion expected to aid symptom resolution. -  Included Achilles and calf stretching exercises in rehabilitation plan.  High arch foot type High arch foot type predisposing to plantar fasciitis. No current need for custom orthotics; symptoms managed conservatively. Discussed possible future benefit  of orthotics if symptoms recur. - Discussed potential future benefit of orthotics for long-term support if symptoms recur; deferred at this time.  Procedure: Injection Tendon/Ligament Discussed alternatives, risks, complications and verbal consent was obtained.  Location: Left plantar fascia medial approach via glabrous junction. Skin Prep: Alcohol. Injectate: 1cc 0.5% marcaine plain, 1 cc betamethasone  Soluspan Disposition: Patient tolerated procedure well. Injection site dressed with a band-aid.  Post-injection care was discussed and return precautions discussed.        Return in about 3 weeks (around 12/13/2024) for Plantar Fasciitis.    "

## 2024-11-23 LAB — GENECONNECT MOLECULAR SCREEN: Genetic Analysis Overall Interpretation: NEGATIVE

## 2024-12-14 ENCOUNTER — Ambulatory Visit: Admitting: Podiatry

## 2024-12-14 ENCOUNTER — Encounter: Payer: Self-pay | Admitting: Podiatry

## 2024-12-14 DIAGNOSIS — M216X2 Other acquired deformities of left foot: Secondary | ICD-10-CM

## 2024-12-14 DIAGNOSIS — M722 Plantar fascial fibromatosis: Secondary | ICD-10-CM

## 2024-12-14 NOTE — Progress Notes (Unsigned)
"  °  Subjective:  Patient ID: James Young, male    DOB: 1956/09/27,  MRN: [id number]  Chief Complaint  Patient presents with   Plantar Fasciitis    L foot pain has improved 70 % from last visit. Has been taking meloxicam  (MOBIC ) 15 MG tablet daily.  Has been doing stretches.     Discussed the use of AI scribe software for clinical note transcription with the patient, who gave verbal consent to proceed.  History of Present Illness       Objective:    Physical Exam    No images are attached to the encounter.    Results    Assessment:   1. Plantar fasciitis of left foot   2. Equinus deformity of left foot   3. Cavus deformity of left foot, acquired      Plan:  Patient was evaluated and treated and all questions answered.  Assessment and Plan Assessment & Plan       No follow-ups on file.    "

## 2025-01-10 ENCOUNTER — Ambulatory Visit: Admitting: Podiatry
